# Patient Record
Sex: Female | Born: 1995 | Hispanic: No | Marital: Single | Smoking: Former smoker
Health system: Southern US, Community
[De-identification: ages and names within clinical notes are randomized; demographics above are authoritative.]

## PROBLEM LIST (undated history)

## (undated) ENCOUNTER — Inpatient Hospital Stay (HOSPITAL_COMMUNITY): Payer: Self-pay

## (undated) DIAGNOSIS — F913 Oppositional defiant disorder: Secondary | ICD-10-CM

## (undated) DIAGNOSIS — F329 Major depressive disorder, single episode, unspecified: Secondary | ICD-10-CM

## (undated) DIAGNOSIS — F909 Attention-deficit hyperactivity disorder, unspecified type: Secondary | ICD-10-CM

## (undated) DIAGNOSIS — T71162A Asphyxiation due to hanging, intentional self-harm, initial encounter: Secondary | ICD-10-CM

## (undated) DIAGNOSIS — F32A Depression, unspecified: Secondary | ICD-10-CM

## (undated) DIAGNOSIS — F39 Unspecified mood [affective] disorder: Secondary | ICD-10-CM

## (undated) DIAGNOSIS — F4310 Post-traumatic stress disorder, unspecified: Secondary | ICD-10-CM

## (undated) HISTORY — PX: NO PAST SURGERIES: SHX2092

---
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
## 2016-06-05 ENCOUNTER — Emergency Department (HOSPITAL_COMMUNITY)
Admission: EM | Admit: 2016-06-05 | Discharge: 2016-06-06 | Disposition: A | Discharge status: Home / Self Care | Payer: Medicaid Other | Attending: Emergency Medicine | Admitting: Emergency Medicine

## 2016-06-05 ENCOUNTER — Encounter (HOSPITAL_COMMUNITY): Payer: Self-pay

## 2016-06-05 DIAGNOSIS — Z5321 Procedure and treatment not carried out due to patient leaving prior to being seen by health care provider: Secondary | ICD-10-CM | POA: Diagnosis not present

## 2016-06-05 DIAGNOSIS — S3992XA Unspecified injury of lower back, initial encounter: Secondary | ICD-10-CM | POA: Insufficient documentation

## 2016-06-05 DIAGNOSIS — Y92410 Unspecified street and highway as the place of occurrence of the external cause: Secondary | ICD-10-CM | POA: Insufficient documentation

## 2016-06-05 DIAGNOSIS — M7918 Myalgia, other site: Secondary | ICD-10-CM

## 2016-06-05 DIAGNOSIS — F17200 Nicotine dependence, unspecified, uncomplicated: Secondary | ICD-10-CM | POA: Diagnosis not present

## 2016-06-05 DIAGNOSIS — F909 Attention-deficit hyperactivity disorder, unspecified type: Secondary | ICD-10-CM | POA: Insufficient documentation

## 2016-06-05 DIAGNOSIS — Y999 Unspecified external cause status: Secondary | ICD-10-CM | POA: Diagnosis not present

## 2016-06-05 DIAGNOSIS — Y939 Activity, unspecified: Secondary | ICD-10-CM | POA: Diagnosis not present

## 2016-06-05 LAB — POC URINE PREG, ED: Preg Test, Ur: NEGATIVE

## 2016-06-05 MED ORDER — OXYCODONE-ACETAMINOPHEN 5-325 MG PO TABS
ORAL_TABLET | ORAL | Status: AC
  Filled 2016-06-05: qty 1

## 2016-06-05 MED ORDER — OXYCODONE-ACETAMINOPHEN 5-325 MG PO TABS
1.0000 | ORAL_TABLET | ORAL | Status: DC | PRN
  Administered 2016-06-05: 1 via ORAL

## 2016-06-05 NOTE — ED Provider Notes (Signed)
Patient left without being seen, I did not see or evaluate them.    Lyndal Pulleyaniel Debby Clyne, MD 06/05/16 [phone]
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
## 2016-06-05 NOTE — ED Notes (Signed)
Tech called pt name three times in lobby. No response.

## 2016-06-05 NOTE — ED Notes (Signed)
[facility] requesting a room "now". Updated pt on wait times and educated on acuity.

## 2016-06-06 ENCOUNTER — Emergency Department (HOSPITAL_COMMUNITY): Payer: Medicaid Other

## 2016-06-06 MED ORDER — CYCLOBENZAPRINE HCL 10 MG PO TABS: 10.0000 mg | ORAL_TABLET | Freq: Two times a day (BID) | ORAL | 0 refills | Status: DC | PRN

## 2016-06-06 MED ORDER — NAPROXEN 375 MG PO TABS: 375.0000 mg | ORAL_TABLET | Freq: Two times a day (BID) | ORAL | 0 refills | Status: DC
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
## 2016-06-06 NOTE — ED Notes (Signed)
Pt sts she is ready to go. PA made aware.

## 2016-06-13 ENCOUNTER — Other Ambulatory Visit (HOSPITAL_COMMUNITY)
Admission: RE | Admit: 2016-06-13 | Discharge: 2016-06-13 | Disposition: A | Discharge status: Home / Self Care | Payer: Medicaid Other | Source: Ambulatory Visit | Attending: Family Medicine | Admitting: Family Medicine

## 2016-06-13 ENCOUNTER — Ambulatory Visit (INDEPENDENT_AMBULATORY_CARE_PROVIDER_SITE_OTHER): Payer: Medicaid Other | Admitting: Family Medicine

## 2016-06-13 ENCOUNTER — Encounter: Payer: Self-pay | Admitting: Family Medicine

## 2016-06-13 VITALS — BP 122/62 | HR 96 | Temp 98.4°F | Wt 174.2 lb

## 2016-06-13 DIAGNOSIS — Z113 Encounter for screening for infections with a predominantly sexual mode of transmission: Secondary | ICD-10-CM | POA: Diagnosis not present

## 2016-06-13 DIAGNOSIS — Z202 Contact with and (suspected) exposure to infections with a predominantly sexual mode of transmission: Secondary | ICD-10-CM | POA: Insufficient documentation

## 2016-06-13 DIAGNOSIS — Z309 Encounter for contraceptive management, unspecified: Secondary | ICD-10-CM

## 2016-06-13 LAB — POCT WET PREP (WET MOUNT)
Clue Cells Wet Prep Whiff POC: NEGATIVE
Trichomonas Wet Prep HPF POC: ABSENT

## 2016-06-13 LAB — POCT URINE PREGNANCY: PREG TEST UR: NEGATIVE

## 2016-06-13 MED ORDER — MEDROXYPROGESTERONE ACETATE 150 MG/ML IM SUSP
150.0000 mg | Freq: Once | INTRAMUSCULAR | Status: AC
  Administered 2016-06-13: 150 mg via INTRAMUSCULAR

## 2016-06-13 NOTE — Assessment & Plan Note (Signed)
GC/chlamydia collected today. Wet mount neg.

## 2016-06-13 NOTE — Patient Instructions (Signed)
It was good to meet you today!  For birth control - You received a depo injection today - Please return in 2 weeks for a urine pregnancy test  We are checking some labs today, and someone will call you or send you a letter with the results when they are available.   Dr. Leland HerElsia J Kirbie Stodghill, DO [facility]

## 2016-06-13 NOTE — Assessment & Plan Note (Signed)
UPT negative today so depo provera injection given. Advised patient to return in 2 weeks for repeat UPT since has been sexually active in the last week without contraception. Also advised patient to use backup condoms for 2 weeks following today.

## 2016-06-13 NOTE — Progress Notes (Signed)
    Subjective:  Stacey [name] is a 20 y.o. female who [facility] need [facility].  HPI: Need for contraception Patient is requesting depo-provera for contraception. Per chart review patient previously on nexplanon and had it removed after 3 years on 11/01/15, was prescribed OCPs at that time. States did not take OCPs because she found it difficult to remember to take them daily and therefore decided she would do better on a different method of birth control. Has had regular monthly periods while being off depo. LMP 06/05/16, has been sexually active in the last week without contraception.  Possible STD exposure States is monogamous with one female partner but has concerns for possible STD exposure on his part. Denies vaginal discharge, itching, rashes, dysuria.   ROS: Per HPI  Objective:  Physical Exam: BP 122/62   Pulse 96   Temp 98.4 F (36.9 C) (Oral)   Wt 174 lb 3.2 oz (79 kg)   LMP 06/05/2016 Comment: neg preg test 06/05/16  SpO2 98%   BMI 27.28 kg/m   Gen: NAD, resting comfortably CV: RRR with no murmurs appreciated Pulm: NWOB, CTAB with no crackles, wheezes, or rhonchi GU: normal female, no rashes. Vaginal walls pink and moist without lesions. No abnormal discharge from the cervix Skin: warm, dry Neuro: grossly normal, moves all extremities Psych: Normal affect and thought content  Results for orders placed or performed in visit on 06/13/16 (from the past 72 hour(s))  POCT urine pregnancy     Status: None   Collection Time: 06/13/16  3:38 PM  Result Value Ref Range   Preg Test, Ur Negative Negative  POCT Wet Prep Mellody Drown(Wet [place])     Status: Abnormal   Collection Time: 06/13/16  4:20 PM  Result Value Ref Range   Source Wet Prep POC VAG    WBC, Wet Prep HPF POC NONE    Bacteria Wet Prep HPF POC Few Few   Clue Cells Wet Prep HPF POC None None   Clue Cells Wet Prep Whiff POC Negative Whiff    Yeast Wet Prep HPF POC Few    Trichomonas Wet  Prep HPF POC Absent Absent     Assessment/Plan:  Contraception management UPT negative today so depo provera injection given. Advised patient to return in 2 weeks for repeat UPT since has been sexually active in the last week without contraception. Also advised patient to use backup condoms for 2 weeks following today.  Possible exposure to STD GC/chlamydia collected today. Wet mount neg.   Leland HerElsia J Masiya Claassen, DO PGY-1, [facility] 06/13/2016 4:04 PM

## 2016-06-14 LAB — GC/CHLAMYDIA PROBE AMP (~~LOC~~) NOT AT ARMC
Chlamydia: NEGATIVE
Neisseria Gonorrhea: NEGATIVE

## 2016-06-15 ENCOUNTER — Encounter: Payer: Self-pay | Admitting: Family Medicine

## 2016-07-26 ENCOUNTER — Ambulatory Visit (INDEPENDENT_AMBULATORY_CARE_PROVIDER_SITE_OTHER): Payer: Medicaid Other | Admitting: Family Medicine

## 2016-07-26 ENCOUNTER — Encounter: Payer: Self-pay | Admitting: Family Medicine

## 2016-07-26 VITALS — BP 108/76 | HR 74 | Temp 98.5°F | Ht 67.0 in | Wt 170.6 lb

## 2016-07-26 DIAGNOSIS — B349 Viral infection, unspecified: Secondary | ICD-10-CM | POA: Diagnosis not present

## 2016-07-26 NOTE — Patient Instructions (Addendum)
It appears that you have a viral upper respiratory infection (cold).  Cold symptoms can last up to 2 weeks.    - Get plenty of rest and drink plenty of fluids. - Try to breathe moist air. Use a humidifier or take a steamy shower. - Consume warm fluids (soup or tea) to provide relief for a stuffy nose and to loosen phlegm. - For nasal stuffiness, try saline nasal spray or a Neti Pot. - For sore throat pain relief: suck on throat lozenges, hard candy or popsicles; gargle with warm salt water (1/4 tsp. salt per 8 oz. of water); and eat soft, bland foods. - Eat a well-balanced diet. If you cannot, ensure you are getting enough nutrients by taking a daily multivitamin. - Avoid dairy products, as they can thicken phlegm. - Avoid alcohol, as it impairs your body's immune system.  CONTACT YOUR DOCTOR IF YOU EXPERIENCE ANY OF THE FOLLOWING: - High fever - Ear pain - Sinus-type headache - Unusually severe cold symptoms - Cough that gets worse while other cold symptoms improve - Flare up of any chronic lung problem, such as asthma - Your symptoms persist longer than 2 weeks  Food Choices to Help Relieve Diarrhea, Adult When you have diarrhea, the foods you eat and your eating habits are very important. Choosing the right foods and drinks can help relieve diarrhea. Also, because diarrhea can last up to 7 days, you need to replace lost fluids and electrolytes (such as sodium, potassium, and chloride) in order to help prevent dehydration. What general guidelines do I need to follow?  Slowly drink 1 cup (8 oz) of fluid for each episode of diarrhea. If you are getting enough fluid, your urine will be clear or pale yellow.  Eat starchy foods. Some good choices include white rice, white toast, pasta, low-fiber cereal, baked potatoes (without the skin), saltine crackers, and bagels.  Avoid large servings of any cooked vegetables.  Limit fruit to two servings per day. A serving is  cup or 1 small  piece.  Choose foods with less than 2 g of fiber per serving.  Limit fats to less than 8 tsp (38 g) per day.  Avoid fried foods.  Eat foods that have probiotics in them. Probiotics can be found in certain dairy products.  Avoid foods and beverages that may increase the speed at which food moves through the stomach and intestines (gastrointestinal tract). Things to avoid include:  High-fiber foods, such as dried fruit, raw fruits and vegetables, nuts, seeds, and whole grain foods.  Spicy foods and high-fat foods.  Foods and beverages sweetened with high-fructose corn syrup, honey, or sugar alcohols such as xylitol, sorbitol, and mannitol. What foods are recommended? Grains  White rice. White, [place]French, or pita breads (fresh or toasted), including plain rolls, buns, or bagels. White pasta. Saltine, soda, or graham crackers. Pretzels. Low-fiber cereal. Cooked cereals made with water (such as cornmeal, farina, or cream cereals). Plain muffins. Matzo. Melba toast. Zwieback. Vegetables  Potatoes (without the skin). Strained tomato and vegetable juices. Most well-cooked and canned vegetables without seeds. Tender lettuce. Fruits  Cooked or canned applesauce, apricots, cherries, fruit cocktail, grapefruit, peaches, pears, or plums. Fresh bananas, apples without skin, cherries, grapes, cantaloupe, grapefruit, peaches, oranges, or plums. Meat and Other Protein Products  Baked or boiled chicken. Eggs. Tofu. Fish. Seafood. Smooth peanut butter. Ground or well-cooked tender beef, ham, veal, lamb, pork, or poultry. Dairy  Plain yogurt, kefir, and unsweetened liquid yogurt. Lactose-free milk, buttermilk, or soy  milk. Plain hard cheese. Beverages  Sport drinks. Clear broths. Diluted fruit juices (except prune). Regular, caffeine-free sodas such as ginger ale. Water. Decaffeinated teas. Oral rehydration solutions. Sugar-free beverages not sweetened with sugar alcohols. Other  Bouillon, broth, or soups  made from recommended foods. The items listed above may not be a complete list of recommended foods or beverages. Contact your dietitian for more options.  What foods are not recommended? Grains  Whole grain, whole wheat, bran, or rye breads, rolls, pastas, crackers, and cereals. Wild or brown rice. Cereals that contain more than 2 g of fiber per serving. Corn tortillas or taco shells. Cooked or dry oatmeal. Granola. Popcorn. Vegetables  Raw vegetables. Cabbage, broccoli, Brussels sprouts, artichokes, baked beans, beet greens, corn, kale, legumes, peas, sweet potatoes, and yams. Potato skins. Cooked spinach and cabbage. Fruits  Dried fruit, including raisins and dates. Raw fruits. Stewed or dried prunes. Fresh apples with skin, apricots, mangoes, pears, raspberries, and strawberries. Meat and Other Protein Products  Chunky peanut butter. Nuts and seeds. Beans and lentils. Tomasa Blase. Dairy  High-fat cheeses. Milk, chocolate milk, and beverages made with milk, such as milk shakes. Cream. Ice cream. Sweets and Desserts  Sweet rolls, doughnuts, and sweet breads. Pancakes and waffles. Fats and Oils  Butter. Cream sauces. Margarine. Salad oils. Plain salad dressings. Olives. Avocados. Beverages  Caffeinated beverages (such as coffee, tea, soda, or energy drinks). Alcoholic beverages. Fruit juices with pulp. Prune juice. Soft drinks sweetened with high-fructose corn syrup or sugar alcohols. Other  Coconut. Hot sauce. Chili powder. Mayonnaise. Gravy. Cream-based or milk-based soups. The items listed above may not be a complete list of foods and beverages to avoid. Contact your dietitian for more information.  What should I do if I become dehydrated? Diarrhea can sometimes lead to dehydration. Signs of dehydration include dark urine and dry mouth and skin. If you think you are dehydrated, you should rehydrate with an oral rehydration solution. These solutions can be purchased at pharmacies, retail stores,  or online. Drink -1 cup (120-240 mL) of oral rehydration solution each time you have an episode of diarrhea. If drinking this amount makes your diarrhea worse, try drinking smaller amounts more often. For example, drink 1-3 tsp (5-15 mL) every 5-10 minutes. A general rule for staying hydrated is to drink 1-2 L of fluid per day. Talk to your health care provider about the specific amount you should be drinking each day. Drink enough fluids to keep your urine clear or pale yellow. This information is not intended to replace advice given to you by your health care provider. Make sure you discuss any questions you have with your health care provider. Document Released: 08/25/2003 Document Revised: 11/10/2015 Document Reviewed: 04/27/2013 Elsevier Interactive Patient Education  2017 [employer].

## 2016-07-26 NOTE — Progress Notes (Signed)
   Subjective: CC: illness WUJ:[id number]:Stacey [name] is a 21 y.o. female presenting to clinic today for same day appointment. PCP: Janit PaganENIOLA, KEHINDE, MD Concerns today include:  1. URI/GI Patient reports a 5 day history of diarrhea, cough, congestion, congestion.  She reports a fever at school yesterday.  She notes that she had a fever to 101F yesterday.  She has been using holistic Congohinese remedies at home with moderate improvement but notes that she is having to miss school and work due to illness and needs documentation for this.  She works at [employer] and is in Restaurant manager, fast foodcosmetology school.  She has been hydrating well.  Denies vomiting, hematochezia.  Allergies  Allergen Reactions  . Penicillins Other (See Comments)    Has patient had a PCN reaction causing immediate rash, facial/tongue/throat swelling, SOB or lightheadedness with hypotension: No Has patient had a PCN reaction causing severe rash involving mucus membranes or skin necrosis: No Has patient had a PCN reaction that required hospitalization No Has pt had a PCN reaction in the last 10 years? No. Nearly universal familial allergy. Pt has never taken PCN.     Social Hx reviewed: active smoker. MedHx, current medications and allergies reviewed.  Please see EMR. ROS: Per HPI  Objective: Office vital signs reviewed. BP 108/76   Pulse 74   Temp 98.5 F (36.9 C) (Oral)   Ht 5\' 7"  (1.702 m)   Wt 170 lb 9.6 oz (77.4 kg)   LMP 07/04/2016 Comment: started as spotting and is still bleeding  SpO2 98%   BMI 26.72 kg/m   Physical Examination:  General: Awake, alert, well nourished, well appearing female, lying down on exam table covered up, No acute distress HEENT: Normal    Neck: No masses palpated. No lymphadenopathy    Ears: Tympanic membranes intact, normal light reflex, no erythema, no bulging    Eyes: PERRLA, EOMI, sclera white    Nose: nasal turbinates moist, no nasal discharge    Throat: moist mucus membranes, no erythema,  no tonsillar exudate.  Airway is patent Cardio: regular rate and rhythm, S1S2 heard, no murmurs appreciated Pulm: clear to auscultation bilaterally, no wheezes, rhonchi or rales; normal work of breathing on room air GI: soft, non-tender, non-distended, bowel sounds present x4, no hepatomegaly, no splenomegaly, no masses  Assessment/ Plan: 21 y.o. female   1. Viral illness.  Suspect that patient is at the end of a viral illness.  She clinically is well appearing with a normal exam.  She works very closely with people.  I agree that if she is having fever at home, she should avoid direct contact with food/ client's faces (facials). - Home care reviewed - Hydration encouraged - Return precautions reviewed - Work/ School notes provided - Follow up with pcp prn   Raliegh IpAshly M Gottschalk, DO PGY-3, [facility]

## 2016-07-27 ENCOUNTER — Ambulatory Visit: Payer: Medicaid Other | Admitting: Family Medicine

## 2016-08-07 ENCOUNTER — Ambulatory Visit: Payer: Medicaid Other | Admitting: Family Medicine

## 2016-08-15 ENCOUNTER — Encounter: Payer: Self-pay | Admitting: Family Medicine

## 2016-08-15 ENCOUNTER — Ambulatory Visit (INDEPENDENT_AMBULATORY_CARE_PROVIDER_SITE_OTHER): Payer: Medicaid Other | Admitting: Family Medicine

## 2016-08-15 VITALS — BP 110/58 | HR 93 | Temp 98.2°F | Ht 67.0 in | Wt 170.0 lb

## 2016-08-15 DIAGNOSIS — B9789 Other viral agents as the cause of diseases classified elsewhere: Secondary | ICD-10-CM

## 2016-08-15 DIAGNOSIS — J069 Acute upper respiratory infection, unspecified: Secondary | ICD-10-CM

## 2016-08-15 DIAGNOSIS — R112 Nausea with vomiting, unspecified: Secondary | ICD-10-CM

## 2016-08-15 LAB — POCT URINALYSIS DIPSTICK
Bilirubin, UA: NEGATIVE
Blood, UA: NEGATIVE
GLUCOSE UA: NEGATIVE
KETONES UA: NEGATIVE
Leukocytes, UA: NEGATIVE
Nitrite, UA: NEGATIVE
Protein, UA: NEGATIVE
SPEC GRAV UA: 1.025
Urobilinogen, UA: 0.2
pH, UA: 7

## 2016-08-15 LAB — POCT URINE PREGNANCY: Preg Test, Ur: NEGATIVE

## 2016-08-15 MED ORDER — IPRATROPIUM BROMIDE 0.06 % NA SOLN: 2.0000 | Freq: Four times a day (QID) | NASAL | 12 refills | Status: DC

## 2016-08-15 MED ORDER — ONDANSETRON HCL 4 MG PO TABS: 4.0000 mg | ORAL_TABLET | Freq: Three times a day (TID) | ORAL | 0 refills | Status: DC | PRN

## 2016-08-15 MED ORDER — GUAIFENESIN-DM 100-10 MG/5ML PO SYRP: 5.0000 mL | ORAL_SOLUTION | ORAL | 0 refills | Status: DC | PRN

## 2016-08-15 MED ORDER — LORATADINE 10 MG PO TABS: 10.0000 mg | ORAL_TABLET | Freq: Every day | ORAL | 11 refills | Status: DC

## 2016-08-15 NOTE — Addendum Note (Signed)
Addended by: Ardith DarkPARKER, Glee Lashomb M on: 08/15/2016 11:45 AM   Modules accepted: Orders

## 2016-08-15 NOTE — Patient Instructions (Signed)
You have a viral infection.  Please take the nasal spray and cough medication as needed.  Take the zofran as needed.  Start the claritin.  Take care,  Dr Jimmey RalphParker

## 2016-08-15 NOTE — Progress Notes (Signed)
   Subjective:  Stacey [name] is a 21 y.o. female who [facility] with a chief complaint of nausea/vomting.   HPI:  Nausea/Vomiting Ate Chipotle two days ago - thinks that it is related to this. Since she has not been able to keep anything down. She has tried "old" zofran which helped some. No diarrhea. No abdominal pain. No fevers or chills.   Cough Symptoms started about 3 weeks ago. She has tried taking nyquil which helped some. Mild sore throat when coughing. Coughing up white-yellow phlegm. Worse at night. She has a little brother with similar symptoms. No shortness of breath.   ROS: Per HPI  PMH: Smoking history reviewed.   Objective:  Physical Exam: BP (!) 110/58   Pulse 93   Temp 98.2 F (36.8 C) (Oral)   Ht 5\' 7"  (1.702 m)   Wt 170 lb (77.1 kg)   SpO2 98%   BMI 26.63 kg/m   Gen: NAD, resting comfortably HEENT: TMs obscured by cerumen bilaterally. OP clear. MMM.  CV: RRR with no murmurs appreciated Pulm: NWOB, CTAB with no crackles, wheezes, or rhonchi GI: Normal bowel sounds present. Soft, Nontender, Nondistended. MSK: no edema, cyanosis, or clubbing noted Skin: warm, dry Neuro: grossly normal, moves all extremities Psych: Normal affect and thought content  Assessment/Plan:  Nausea and Vomiting Likely food poisoning vs mild gastroenteritis. No red flag signs or symptoms. Appears well hydrated. Will give zofran. Return precautions reviewed. Follow up as needed.  Viral URI with Cough No signs of bacterial infection. Will treat symptomatically withatrovent and robutussin. Will also start claritin. Return precautions reviewed. Follow up as needed.   Katina Degreealeb M. Jimmey RalphParker, MD [facility] Resident PGY-3 08/15/2016 11:29 AM

## 2016-09-17 ENCOUNTER — Ambulatory Visit (INDEPENDENT_AMBULATORY_CARE_PROVIDER_SITE_OTHER): Payer: Medicaid Other | Admitting: Student

## 2016-09-17 ENCOUNTER — Other Ambulatory Visit (HOSPITAL_COMMUNITY)
Admission: RE | Admit: 2016-09-17 | Discharge: 2016-09-17 | Disposition: A | Discharge status: Home / Self Care | Payer: Medicaid Other | Source: Ambulatory Visit | Attending: Family Medicine | Admitting: Family Medicine

## 2016-09-17 ENCOUNTER — Telehealth: Payer: Self-pay | Admitting: Student

## 2016-09-17 ENCOUNTER — Encounter: Payer: Self-pay | Admitting: Student

## 2016-09-17 VITALS — BP 110/64 | HR 77 | Temp 98.9°F | Ht 67.0 in | Wt 174.0 lb

## 2016-09-17 DIAGNOSIS — Z3042 Encounter for surveillance of injectable contraceptive: Secondary | ICD-10-CM | POA: Diagnosis not present

## 2016-09-17 DIAGNOSIS — R300 Dysuria: Secondary | ICD-10-CM

## 2016-09-17 DIAGNOSIS — N898 Other specified noninflammatory disorders of vagina: Secondary | ICD-10-CM

## 2016-09-17 DIAGNOSIS — Z113 Encounter for screening for infections with a predominantly sexual mode of transmission: Secondary | ICD-10-CM | POA: Insufficient documentation

## 2016-09-17 DIAGNOSIS — R3989 Other symptoms and signs involving the genitourinary system: Secondary | ICD-10-CM

## 2016-09-17 LAB — POCT WET PREP (WET MOUNT)
Clue Cells Wet Prep Whiff POC: POSITIVE
TRICHOMONAS WET PREP HPF POC: ABSENT

## 2016-09-17 LAB — POCT URINALYSIS DIP (MANUAL ENTRY)
BILIRUBIN UA: NEGATIVE
BILIRUBIN UA: NEGATIVE
GLUCOSE UA: NEGATIVE
Nitrite, UA: NEGATIVE
Protein Ur, POC: 30 — AB
Spec Grav, UA: 1.02 (ref 1.030–1.035)
Urobilinogen, UA: 0.2 (ref ?–2.0)
pH, UA: 8.5 — AB (ref 5.0–8.0)

## 2016-09-17 LAB — POCT UA - MICROSCOPIC ONLY

## 2016-09-17 LAB — POCT URINE PREGNANCY: PREG TEST UR: NEGATIVE

## 2016-09-17 MED ORDER — SULFAMETHOXAZOLE-TRIMETHOPRIM 800-160 MG PO TABS: 1.0000 | ORAL_TABLET | Freq: Two times a day (BID) | ORAL | 0 refills | Status: DC

## 2016-09-17 MED ORDER — METRONIDAZOLE 500 MG PO TABS: 500.0000 mg | ORAL_TABLET | Freq: Two times a day (BID) | ORAL | 0 refills | Status: DC

## 2016-09-17 MED ORDER — MEDROXYPROGESTERONE ACETATE 150 MG/ML IM SUSP
150.0000 mg | Freq: Once | INTRAMUSCULAR | Status: AC
  Administered 2016-09-17: 150 mg via INTRAMUSCULAR

## 2016-09-17 NOTE — Assessment & Plan Note (Signed)
Vaginal discharge with overall reassuring exam. She did have some abdominal pain but no CMT. With no fever and no CMT PID is less likely. Abdominal pain is more likely due to concurrent UTI - wet prep and GC/CT collected, will treat as needed

## 2016-09-17 NOTE — Assessment & Plan Note (Signed)
UA shows evidence of UTI. Will treat with bactrim. Obtain Culture to assess sesntivities

## 2016-09-17 NOTE — Patient Instructions (Signed)
You have a Urinary Tract infection Please take Bactrim as prescribed You will be called about any other abnormal labs If you have questions or concerns, call the office at [phone]

## 2016-09-17 NOTE — Progress Notes (Signed)
   Subjective:    Patient ID: Stacey [name], female    DOB: [birth year], 21 y.o.   MRN: [id number]   CC: vaginal discharge  HPI: 21 y/o presents for vaginal discharge  Vaginal discharge - present for 1 week - no odor or vaginal pain   Dysuria - present for 3-4 days - some suprapubic pain - no fevers or back pain  Contraception - on depo provera - she is over due for it  Smoking status reviewed  Review of Systems  Per HPI, else denies, chest pain, shortness of breath, abdominal pain    Objective:  BP 110/64   Pulse 77   Temp 98.9 F (37.2 C) (Oral)   Ht  (1.702 m)   Wt 174 lb (78.9 kg)   SpO2 99%   BMI 27.25 kg/m  Vitals and nursing note reviewed  General: NAD Cardiac: RRR Respiratory: CTAB, normal effort Pelvic: Normal EGBUS, normal vaginal canal with moderate white discharge, normal cervix with no CMT, normal mobile uterus, normal adnexa with no masses, but mild adnexal and suprapubic tenderness     Assessment & Plan:    Dysuria UA shows evidence of UTI. Will treat with bactrim. Obtain Culture to assess sesntivities  Contraception U preg negative. Depo provera given. LARC counseling given and contraception hand out given  Vaginal discharge Vaginal discharge with overall reassuring exam. She did have some abdominal pain but no CMT. With no fever and no CMT PID is less likely. Abdominal pain is more likely due to concurrent UTI - wet prep and GC/CT collected, will treat as needed    Alyssa A. Kennon Rounds MD, MS [facility] Resident PGY-3 Pager [phone]

## 2016-09-17 NOTE — Assessment & Plan Note (Signed)
U preg negative. Depo provera given. LARC counseling given and contraception hand out given

## 2016-09-18 ENCOUNTER — Ambulatory Visit: Payer: Medicaid Other | Admitting: Family Medicine

## 2016-09-18 LAB — URINE CYTOLOGY ANCILLARY ONLY
CHLAMYDIA, DNA PROBE: NEGATIVE
Neisseria Gonorrhea: NEGATIVE

## 2016-09-19 NOTE — Telephone Encounter (Signed)
Please call the patient and tell her that her GC/Ct were negative

## 2016-09-19 NOTE — Telephone Encounter (Signed)
LM for patient to call back. Dezzie Badilla,CMA  

## 2016-09-19 NOTE — Telephone Encounter (Signed)
Pt contacted and informed of negative std results.  

## 2016-09-27 ENCOUNTER — Encounter: Payer: Self-pay | Admitting: Obstetrics and Gynecology

## 2016-09-27 ENCOUNTER — Ambulatory Visit (INDEPENDENT_AMBULATORY_CARE_PROVIDER_SITE_OTHER): Payer: Medicaid Other | Admitting: Obstetrics and Gynecology

## 2016-09-27 VITALS — BP 98/60 | HR 98 | Temp 98.1°F | Wt 171.0 lb

## 2016-09-27 DIAGNOSIS — K529 Noninfective gastroenteritis and colitis, unspecified: Secondary | ICD-10-CM | POA: Diagnosis present

## 2016-09-27 MED ORDER — ONDANSETRON HCL 4 MG PO TABS: 4.0000 mg | ORAL_TABLET | Freq: Three times a day (TID) | ORAL | 0 refills | Status: DC | PRN

## 2016-09-27 NOTE — Patient Instructions (Signed)

## 2016-09-27 NOTE — Progress Notes (Signed)
   Subjective:   Patient ID: Stacey [name], female    DOB: August 14, 1995, 21 y.o.   MRN: [id number]  Patient presents for Same Day Appointment  Chief Complaint  Patient presents with  . Emesis  . Diarrhea    HPI: # Upset stomach Cant eat and cant hold water down Started Sunday of last week hasnt had a way to get here  Abdominal pain; intermittent symptoms are improving Throwing up everytime puts something in mouth Missed work Using the bathroom normal  Loose stools  No fevers or chills Tried pepto but didn't work  On depo for birth control  Review of Systems   See HPI for ROS.   History  Smoking Status  . Current Every Day Smoker  Smokeless Tobacco  . Never Used    Past medical history, surgical, family, and social history reviewed and updated in the EMR as appropriate.  Pertinent Historical Findings include: None Objective:  BP 98/60   Pulse 98   Temp 98.1 F (36.7 C) (Oral)   Wt 171 lb (77.6 kg)   LMP  (LMP Unknown)   SpO2 99%   BMI 26.78 kg/m  Vitals and nursing note reviewed  Physical Exam  Constitutional: She is well-developed, well-nourished, and in no distress.  HENT:  Mouth/Throat: Oropharynx is clear and moist.  Neck: Normal range of motion. Neck supple.  Cardiovascular: Normal rate, regular rhythm and normal heart sounds.   Pulmonary/Chest: Effort normal and breath sounds normal.  Abdominal: Soft. Bowel sounds are normal. She exhibits no distension. There is no tenderness. There is no guarding.    Assessment & Plan:  1. Gastroenteritis, acute History consistent with a viral gastroenteritis. Vitals are stable and afebrile. Patient has improving symptoms. Able to tolerate liquids but still some nausea with solids. Rx given for zofran. Patient encouraged to stay hydrated. Conservative measures. Work note given.   Diagnosis and plan along with any newly prescribed medication(s) were discussed in detail with this patient today. The patient  verbalized understanding and agreed with the plan. Patient advised if symptoms worsen return to clinic or ER.   PATIENT EDUCATION PROVIDED: See AVS   Caryl Ada, DO 09/27/2016, 2:10 PM PGY-3, [facility]

## 2017-05-07 ENCOUNTER — Ambulatory Visit: Payer: Self-pay | Admitting: Family Medicine

## 2017-05-14 DIAGNOSIS — H5213 Myopia, bilateral: Secondary | ICD-10-CM | POA: Diagnosis not present

## 2017-05-14 DIAGNOSIS — H52223 Regular astigmatism, bilateral: Secondary | ICD-10-CM | POA: Diagnosis not present

## 2017-07-17 ENCOUNTER — Ambulatory Visit: Payer: Medicaid Other | Admitting: Internal Medicine

## 2017-07-19 ENCOUNTER — Encounter: Payer: Self-pay | Admitting: Family Medicine

## 2017-07-19 ENCOUNTER — Ambulatory Visit (INDEPENDENT_AMBULATORY_CARE_PROVIDER_SITE_OTHER): Payer: Medicaid Other | Admitting: Family Medicine

## 2017-07-19 ENCOUNTER — Other Ambulatory Visit (HOSPITAL_COMMUNITY)
Admission: RE | Admit: 2017-07-19 | Discharge: 2017-07-19 | Disposition: A | Discharge status: Home / Self Care | Payer: Medicaid Other | Source: Ambulatory Visit | Attending: Family Medicine | Admitting: Family Medicine

## 2017-07-19 ENCOUNTER — Telehealth: Payer: Self-pay | Admitting: Family Medicine

## 2017-07-19 ENCOUNTER — Other Ambulatory Visit: Payer: Self-pay

## 2017-07-19 VITALS — BP 106/76 | HR 100 | Temp 98.7°F | Ht 67.0 in | Wt 141.4 lb

## 2017-07-19 DIAGNOSIS — Z7689 Persons encountering health services in other specified circumstances: Secondary | ICD-10-CM | POA: Diagnosis not present

## 2017-07-19 DIAGNOSIS — Z113 Encounter for screening for infections with a predominantly sexual mode of transmission: Secondary | ICD-10-CM

## 2017-07-19 DIAGNOSIS — F39 Unspecified mood [affective] disorder: Secondary | ICD-10-CM | POA: Diagnosis not present

## 2017-07-19 DIAGNOSIS — A5901 Trichomonal vulvovaginitis: Secondary | ICD-10-CM | POA: Diagnosis not present

## 2017-07-19 LAB — POCT WET PREP (WET MOUNT): Clue Cells Wet Prep Whiff POC: POSITIVE

## 2017-07-19 MED ORDER — METRONIDAZOLE 500 MG PO TABS: ORAL_TABLET | ORAL | 0 refills | Status: DC

## 2017-07-19 NOTE — Assessment & Plan Note (Signed)
Seems to be doing well off meds. Symptoms resolved. We will monitor.

## 2017-07-19 NOTE — Progress Notes (Signed)
Subjective:     Patient ID: Stacey [name], female   DOB: 05-20-1996, 21 y.o.   MRN: [id number]  Vaginal Discharge  The patient's primary symptoms include vaginal discharge. The patient's pertinent negatives include no genital itching, genital lesions, genital odor or vaginal bleeding. Primary symptoms comment: LMP: January 2019. This is a new problem. The current episode started 1 to 4 weeks ago (1 week ago). The problem occurs intermittently. The problem has been unchanged. The patient is experiencing no pain. Associated symptoms include nausea. Pertinent negatives include no abdominal pain, dysuria, fever, frequency or vomiting. Associated symptoms comments: Sometimes she has belly pain. The vaginal discharge was clear and white. The vaginal bleeding is spotting (Occasionally she spots after her period after she got off DEPO). She has not been passing clots. She has not been passing tissue. Nothing aggravates the symptoms. Treatments tried: Good hygiene, cleaning soap and water. The treatment provided mild relief. She is sexually active (With same partner of 7 months). No, her partner does not have an STD. She uses nothing for contraception. Her menstrual history has been regular. There is no history of an STD.  Mood Disorder:Denies any concern. Not on medication. She stated she had mood issues when she was struggling to finish high school. Now that she has graduated she feels happy.   No current outpatient medications on file prior to visit.   No current facility-administered medications on file prior to visit.    Past Medical History:  Diagnosis Date  . ADHD (attention deficit hyperactivity disorder)   . Mood disorder (HCC)   . ODD (oppositional defiant disorder)   . PTSD (post-traumatic stress disorder)   . 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  Gastrointestinal: Positive for nausea. Negative for abdominal pain and vomiting.  Genitourinary: Positive for vaginal discharge. Negative for dysuria and frequency.       Objective:   Physical Exam  Constitutional: She appears well-developed. No distress.  Cardiovascular: Normal rate, regular rhythm and normal heart sounds.  No murmur heard. Pulmonary/Chest: Effort normal and breath sounds normal. No respiratory distress. She has no wheezes.  Abdominal: Soft. Bowel sounds are normal. She exhibits no distension and no mass. There is no tenderness.  Genitourinary: There is no rash, tenderness or lesion on the right labia. There is no rash, tenderness or lesion on the left labia. Cervix exhibits motion tenderness and discharge. Right adnexum displays no tenderness and no fullness. Left adnexum displays no tenderness and no fullness. No tenderness or bleeding in the vagina. No signs of injury around the vagina. Vaginal discharge found.    Psychiatric: She has a normal mood and affect. Her behavior is normal. Thought content normal.  Nursing note and vitals reviewed.  Depression screen PHQ 2/9 07/19/2017  Decreased Interest 0  Down, Depressed, Hopeless 0  PHQ - 2 Score 0  Altered sleeping 0  Tired, decreased energy 0  Change in appetite 0  Feeling bad or failure about yourself  0  Trouble concentrating 0  Moving slowly or fidgety/restless 0  Suicidal thoughts 0  PHQ-9 Score 0  Difficult doing work/chores Not difficult at all       Assessment:  Vaginal discharge Trichomoniasis Bacterial vaginosis    Plan:     See problem list.  She declined flu shot today.  Contraceptive discussed but she is not interested in pursuing at this time. We will readdress at her next visit.

## 2017-07-19 NOTE — Assessment & Plan Note (Addendum)
Wet prep + Trich and BV. I discussed test result with patient and mode of transmission. She asked so many questions related to mode of transmission which I was able to answer. She was appropriately upset. I discussed treatment recommendation and the need for her partner to get treated as well to prevent reinfection. Start Metronidazole 500 mg BID x 7 which will also treat her BV. Patient did not want for further discussion or wait for her new AVS summary. I called multiple times to check on her but no luck. I will like for her to return in 2-3 weeks for test of cure. We will contact her again. STD counseling done.

## 2017-07-19 NOTE — Telephone Encounter (Signed)
Her listed phone number to follow-up with her multiple times with no response. One of the number belongs to someone else/incorrect ( I deleted the number).  Later when I called it was her mother who picked up. She gave me Stacey [name]'s cell phone number: [phone]. Number updated.  I called the number but she did not pick up. Message left for her to call back.

## 2017-07-19 NOTE — Patient Instructions (Addendum)
`Sexually Transmitted Disease A sexually transmitted disease (STD) is a disease or infection that may be passed (transmitted) from person to person, usually during sexual activity. This may happen by way of saliva, semen, blood, vaginal mucus, or urine. Common STDs include:  Gonorrhea.  Chlamydia.  Syphilis.  HIV and AIDS.  Genital herpes.  Hepatitis B and C.  Trichomonas.  Human papillomavirus (HPV).  Pubic lice.  Scabies.  Mites.  Bacterial vaginosis.  What are the causes? An STD may be caused by bacteria, a virus, or parasites. STDs are often transmitted during sexual activity if one person is infected. However, they may also be transmitted through nonsexual means. STDs may be transmitted after:  Sexual intercourse with an infected person.  Sharing sex toys with an infected person.  Sharing needles with an infected person or using unclean piercing or tattoo needles.  Having intimate contact with the genitals, mouth, or rectal areas of an infected person.  Exposure to infected fluids during birth.  What are the signs or symptoms? Different STDs have different symptoms. Some people may not have any symptoms. If symptoms are present, they may include:  Painful or bloody urination.  Pain in the pelvis, abdomen, vagina, anus, throat, or eyes.  A skin rash, itching, or irritation.  Growths, ulcerations, blisters, or sores in the genital and anal areas.  Abnormal vaginal discharge with or without bad odor.  Penile discharge in men.  Fever.  Pain or bleeding during sexual intercourse.  Swollen glands in the groin area.  Yellow skin and eyes (jaundice). This is seen with hepatitis.  Swollen testicles.  Infertility.  Sores and blisters in the mouth.  How is this diagnosed? To make a diagnosis, your health care provider may:  Take a medical history.  Perform a physical exam.  Take a sample of any discharge to examine.  Swab the throat, cervix,  opening to the penis, rectum, or vagina for testing.  Test a sample of your first morning urine.  Perform blood tests.  Perform a Pap test, if this applies.  Perform a colposcopy.  Perform a laparoscopy.  How is this treated? Treatment depends on the STD. Some STDs may be treated but not cured.  Chlamydia, gonorrhea, trichomonas, and syphilis can be cured with antibiotic medicine.  Genital herpes, hepatitis, and HIV can be treated, but not cured, with prescribed medicines. The medicines lessen symptoms.  Genital warts from HPV can be treated with medicine or by freezing, burning (electrocautery), or surgery. Warts may come back.  HPV cannot be cured with medicine or surgery. However, abnormal areas may be removed from the cervix, vagina, or vulva.  If your diagnosis is confirmed, your recent sexual partners need treatment. This is true even if they are symptom-free or have a negative culture or evaluation. They should not have sex until their health care providers say it is okay.  Your health care provider may test you for infection again 3 months after treatment.  How is this prevented? Take these steps to reduce your risk of getting an STD:  Use latex condoms, dental dams, and water-soluble lubricants during sexual activity. Do not use petroleum jelly or oils.  Avoid having multiple sex partners.  Do not have sex with someone who has other sex partners.  Do not have sex with anyone you do not know or who is at high risk for an STD.  Avoid risky sex practices that can break your skin.  Do not have sex if you have open sores  on your mouth or skin. °· Avoid drinking too much alcohol or taking illegal drugs. Alcohol and drugs can affect your judgment and put you in a vulnerable position. °· Avoid engaging in oral and anal sex acts. °· Get vaccinated for HPV and hepatitis. If you have not received these vaccines in the past, talk to your health care provider about whether one or  both might be right for you. °· If you are at risk of being infected with HIV, it is recommended that you take a prescription medicine daily to prevent HIV infection. This is called pre-exposure prophylaxis (PrEP). You are considered at risk if: °? You are a man who has sex with other men (MSM). °? You are a heterosexual man or woman and are sexually active with more than one partner. °? You take drugs by injection. °? You are sexually active with a partner who has HIV. °· Talk with your health care provider about whether you are at high risk of being infected with HIV. If you choose to begin PrEP, you should first be tested for HIV. You should then be tested every 3 months for as long as you are taking PrEP. ° °Contact a health care provider if: °· See your health care provider. °· Tell your sexual partner(s). They should be tested and treated for any STDs. °· Do not have sex until your health care provider says it is okay. °Get help right away if: °Contact your health care provider right away if: °· You have severe abdominal pain. °· You are a man and notice swelling or pain in your testicles. °· You are a woman and notice swelling or pain in your vagina. ° °This information is not intended to replace advice given to you by your health care provider. Make sure you discuss any questions you have with your health care provider. °Document Released: 08/25/2002 Document Revised: 12/23/2015 Document Reviewed: 12/23/2012 °Elsevier Interactive Patient Education © 2018 Else[employer] ° ° °Trichomoniasis °Trichomoniasis is an STI (sexually transmitted infection) that can affect both women and men. In women, the outer area of the female genitalia (vulva) and the vagina are affected. In men, the penis is mainly affected, but the prostate and other reproductive organs can also be involved. This condition can be treated with medicine. It often has no symptoms (is asymptomatic), especially in men. °What are the causes? °This  condition is caused by an organism called Trichomonas vaginalis. Trichomoniasis most often spreads from person to person (is contagious) through sexual contact. °What increases the risk? °The following factors may make you more likely to develop this condition: °· Having unprotected sexual intercourse. °· Having sexual intercourse with a partner who has trichomoniasis. °· Having multiple sexual partners. °· Having had previous trichomoniasis infections or other STIs. ° °What are the signs or symptoms? °In women, symptoms of trichomoniasis include: °· Abnormal vaginal discharge that is clear, white, gray, or yellow-green and foamy and has an unusual "fishy" odor. °· Itching and irritation of the vagina and vulva. °· Burning or pain during urination or sexual intercourse. °· Genital redness and swelling. ° °In men, symptoms of trichomoniasis include: °· Penile discharge that may be foamy or contain pus. °· Pain in the penis. This may happen only when urinating. °· Itching or irritation inside the penis. °· Burning after urination or ejaculation. ° °How is this diagnosed? °In women, this condition may be found during a routine Pap test or physical exam. It may be found in men during a   routine physical exam. Your health care provider may perform tests to help diagnose this infection, such as: °· Urine tests (men and women). °· The following in women: °? Testing the pH of the vagina. °? A vaginal swab test that checks for the Trichomonas vaginalis organism. °? Testing vaginal secretions. ° °Your health care provider may test you for other STIs, including HIV (human immunodeficiency virus). °How is this treated? °This condition is treated with medicine taken by mouth (orally), such as metronidazole or tinidazole to fight the infection. Your sexual partner(s) may also need to be tested and treated. °· If you are a woman and you plan to become pregnant or think you may be pregnant, tell your health care provider right away.  Some medicines that are used to treat the infection should not be taken during pregnancy. ° °Your health care provider may recommend over-the-counter medicines or creams to help relieve itching or irritation. You may be tested for infection again 3 months after treatment. °Follow these instructions at home: °· Take and use over-the-counter and prescription medicines, including creams, only as told by your health care provider. °· Do not have sexual intercourse until one week after you finish your medicine, or until your health care provider approves. Ask your health care provider when you may resume sexual intercourse. °· (Women) Do not douche or wear tampons while you have the infection. °· Discuss your infection with your sexual partner(s). Make sure that your partner gets tested and treated, if necessary. °· Keep all follow-up visits as told by your health care provider. This is important. °How is this prevented? °· Use condoms every time you have sex. Using condoms correctly and consistently can help protect against STIs. °· Avoid having multiple sexual partners. °· Talk with your sexual partner about any symptoms that either of you may have, as well as any history of STIs. °· Get tested for STIs and STDs (sexually transmitted diseases) before you have sex. Ask your partner to do the same. °· Do not have sexual contact if you have symptoms of trichomoniasis or another STI. °Contact a health care provider if: °· You still have symptoms after you finish your medicine. °· You develop pain in your abdomen. °· You have pain when you urinate. °· You have bleeding after sexual intercourse. °· You develop a rash. °· You feel nauseous or you vomit. °· You plan to become pregnant or think you may be pregnant. °Summary °· Trichomoniasis is an STI (sexually transmitted infection) that can affect both women and men. °· This condition often has no symptoms (is asymptomatic), especially in men. °· You should not have sexual  intercourse until one week after you finish your medicine, or until your health care provider approves. Ask your health care provider when you may resume sexual intercourse. °· Discuss your infection with your sexual partner. Make sure that your partner gets tested and treated, if necessary. °This information is not intended to replace advice given to you by your health care provider. Make sure you discuss any questions you have with your health care provider. °Document Released: 11/28/2000 Document Revised: 04/27/2016 Document Reviewed: 04/27/2016 °Elsevier Interactive Patient Education © 2017 Elsevier Inc. ° °

## 2017-07-19 NOTE — Assessment & Plan Note (Signed)
GC/Chlamydia checked. We will contact her with result. HIV and RPR are pending as well.

## 2017-07-22 LAB — CERVICOVAGINAL ANCILLARY ONLY
Chlamydia: NEGATIVE
Neisseria Gonorrhea: NEGATIVE

## 2017-07-22 NOTE — Telephone Encounter (Signed)
I called and discussed RPR result with her. Final result still pending. I need her to come in soon to discuss management. She is also informed that she will need TOC for Trichomonas for which she stated she already started treatment.  I asked her if she wants me to go ahead and schedule appointment. She stated she will call back to do that since she need to double check on her work schedule. All questions were answered.  Note: GC/Chlamydia results are still pending at this point.

## 2017-07-22 NOTE — Telephone Encounter (Signed)
Called patient and she would prefer to discuss these results with provider on the phone first.  She will be at her number waiting for the call. Jazmin Hartsell,CMA

## 2017-07-23 ENCOUNTER — Telehealth: Payer: Self-pay | Admitting: Family Medicine

## 2017-07-23 LAB — HIV ANTIBODY (ROUTINE TESTING W REFLEX): HIV SCREEN 4TH GENERATION: NONREACTIVE

## 2017-07-23 LAB — RPR, QUANT+TP ABS (REFLEX)
Rapid Plasma Reagin, Quant: 1:1 {titer} — ABNORMAL HIGH
T Pallidum Abs: NEGATIVE

## 2017-07-23 LAB — RPR: RPR: REACTIVE — AB

## 2017-07-23 NOTE — Telephone Encounter (Signed)
STD result discussed with her.  GC/Chlamydia neg.  Trichomonas positive and she has been treated. She will return in 3 weeks for TOC.  RPR+ with neg T Pallidium Abs: Likely false positive. I discussed with infectious dx specialist (John Campbell,MD). He recommended repeat test in 3-6 months.   I discussed this with the patient. He verbalized understanding. STD counseling done during her last visit.

## 2017-09-05 ENCOUNTER — Telehealth: Payer: Self-pay | Admitting: Family Medicine

## 2017-09-05 DIAGNOSIS — F39 Unspecified mood [affective] disorder: Secondary | ICD-10-CM

## 2017-09-05 NOTE — Telephone Encounter (Signed)
Will forward to MD to place referral for patient and document in the comment section the information on where to send it.  Shenique Childers,CMA

## 2017-09-05 NOTE — Telephone Encounter (Signed)
This pt called needing orders to get psychological therapy from her counselor she has been seeing.  The fax number to send these orders to is [phone] with ATTN: Emerson MonteKathleen Grumblatt. Call pt at [phone] with any questions.

## 2017-09-09 NOTE — Telephone Encounter (Signed)
Entered psychology referral order.

## 2018-03-18 ENCOUNTER — Other Ambulatory Visit: Payer: Self-pay

## 2018-03-18 ENCOUNTER — Ambulatory Visit (INDEPENDENT_AMBULATORY_CARE_PROVIDER_SITE_OTHER): Payer: Medicaid Other | Admitting: Family Medicine

## 2018-03-18 VITALS — BP 96/54 | HR 85 | Temp 98.3°F | Ht 67.0 in | Wt 144.6 lb

## 2018-03-18 DIAGNOSIS — J302 Other seasonal allergic rhinitis: Secondary | ICD-10-CM | POA: Diagnosis not present

## 2018-03-18 MED ORDER — LORATADINE 10 MG PO TABS: 10.0000 mg | ORAL_TABLET | Freq: Every day | ORAL | 11 refills | Status: DC

## 2018-03-18 MED ORDER — FLUTICASONE PROPIONATE 50 MCG/ACT NA SUSP: 2.0000 | Freq: Every day | NASAL | 6 refills | Status: DC

## 2018-03-18 NOTE — Patient Instructions (Addendum)
It was a pleasure to see you today! Thank you for choosing [facility] for your primary care. Stacey [name] was seen for congestion.   Our plans for today were:  Your congestion is likely due to allergies.   Try the allergy medicine I sent. You can use both the oral tablet and the nasal spray.   You need to come back and see Dr. Lum Babe for your PAP smear, to discuss smoking, and to discuss birth control.   Best,  Dr. Chanetta Marshall

## 2018-03-18 NOTE — Progress Notes (Signed)
   CC: cold sxs, congestion  HPI  Cold sxs - no fevers, but some dry cough and having to clear her throat a lot. Had one loose stool 2 days, no BM since. Normally has BMs every 1-2 days. And has also some nasal congestion, also started 2 days ago. Has tried mucinex, which works in that she doesn't have runny nose and cough is less. She still feels nasal fullness. Has tried nettie pot. Has had allergies in the past, but not the last two years.   Went out to eat on 9/26 and had a lot of seafood, did "drink a little." she vomited that night and felt dizzy. Sometimes she gets nauseous with her period.   ROS: Denies CP, SOB, abdominal pain, dysuria, changes in BMs.   CC, SH/smoking status, and VS noted  Objective: BP (!) 96/54   Pulse 85   Temp 98.3 F (36.8 C) (Oral)   Ht 5\' 7"  (1.702 m)   Wt 144 lb 9.6 oz (65.6 kg)   LMP 03/16/2018 (Exact Date)   SpO2 97%   BMI 22.65 kg/m  Gen: NAD, alert, cooperative, and pleasant. HEENT: NCAT, EOMI, PERRL, TMs clear bilaterally, oropharynx clear, nasal turbinates injected and swollen.  CV: RRR, no murmur Resp: CTAB, no wheezes, non-labored Ext: No edema, warm Neuro: Alert and oriented, Speech clear, No gross deficits  Assessment and plan:  Seasonal allergic rhinitis No infectious sounding symptoms, exam with just inflammed turbinates. Start flonase and claritin, return if no improvement or further concerns.    No orders of the defined types were placed in this encounter.   Meds ordered this encounter  Medications  . loratadine (CLARITIN) 10 MG tablet    Sig: Take 1 tablet (10 mg total) by mouth daily.    Dispense:  30 tablet    Refill:  11  . fluticasone (FLONASE) 50 MCG/ACT nasal spray    Sig: Place 2 sprays into both nostrils daily.    Dispense:  16 g    Refill:  6    Health Maintenance reviewed - patient declines flu shot, offered discussion of birth control options but she wants to follow up withDr. Lum Babe for this.  Loni Muse, MD, PGY3 03/19/2018 1:37 PM

## 2018-03-19 DIAGNOSIS — J302 Other seasonal allergic rhinitis: Secondary | ICD-10-CM | POA: Insufficient documentation

## 2018-03-19 NOTE — Assessment & Plan Note (Signed)
No infectious sounding symptoms, exam with just inflammed turbinates. Start flonase and claritin, return if no improvement or further concerns.

## 2018-04-01 ENCOUNTER — Ambulatory Visit: Payer: Medicaid Other

## 2018-04-02 ENCOUNTER — Other Ambulatory Visit (HOSPITAL_COMMUNITY)
Admission: RE | Admit: 2018-04-02 | Discharge: 2018-04-02 | Disposition: A | Discharge status: Home / Self Care | Payer: Medicaid Other | Source: Ambulatory Visit | Attending: Family Medicine | Admitting: Family Medicine

## 2018-04-02 ENCOUNTER — Encounter: Payer: Self-pay | Admitting: Family Medicine

## 2018-04-02 ENCOUNTER — Other Ambulatory Visit: Payer: Self-pay

## 2018-04-02 ENCOUNTER — Ambulatory Visit (INDEPENDENT_AMBULATORY_CARE_PROVIDER_SITE_OTHER): Payer: Medicaid Other | Admitting: Family Medicine

## 2018-04-02 VITALS — BP 110/70 | HR 101 | Temp 98.2°F | Wt 142.0 lb

## 2018-04-02 DIAGNOSIS — Z01419 Encounter for gynecological examination (general) (routine) without abnormal findings: Secondary | ICD-10-CM

## 2018-04-02 DIAGNOSIS — Z7251 High risk heterosexual behavior: Secondary | ICD-10-CM | POA: Diagnosis not present

## 2018-04-02 DIAGNOSIS — Z124 Encounter for screening for malignant neoplasm of cervix: Secondary | ICD-10-CM | POA: Diagnosis not present

## 2018-04-02 DIAGNOSIS — N898 Other specified noninflammatory disorders of vagina: Secondary | ICD-10-CM | POA: Diagnosis not present

## 2018-04-02 DIAGNOSIS — Z3009 Encounter for other general counseling and advice on contraception: Secondary | ICD-10-CM

## 2018-04-02 DIAGNOSIS — A530 Latent syphilis, unspecified as early or late: Secondary | ICD-10-CM

## 2018-04-02 LAB — POCT WET PREP (WET MOUNT)
Clue Cells Wet Prep Whiff POC: POSITIVE
Trichomonas Wet Prep HPF POC: ABSENT

## 2018-04-02 LAB — POCT URINE PREGNANCY: Preg Test, Ur: NEGATIVE

## 2018-04-02 MED ORDER — NORGESTIM-ETH ESTRAD TRIPHASIC 0.18/0.215/0.25 MG-25 MCG PO TABS: 1.0000 | ORAL_TABLET | Freq: Every day | ORAL | 11 refills | Status: DC

## 2018-04-02 MED ORDER — METRONIDAZOLE 500 MG PO TABS: 500.0000 mg | ORAL_TABLET | Freq: Two times a day (BID) | ORAL | 0 refills | Status: AC

## 2018-04-02 NOTE — Patient Instructions (Signed)
It was nice seeing you today. We completed your PAP and wet prep. We will call with the result.   I highly recommend repeat Syphilis check today. However, since you decline, please schedule lab appointment soon.  Also schedule Flu shot and Tdap vaccination soon.  Thanks.

## 2018-04-02 NOTE — Progress Notes (Signed)
Patient ID: Stacey [name], female   DOB: 1995-08-09, 22 y.o.   MRN: [id number] Subjective:     Stacey [name] is a 22 y.o. female here for a routine exam.  Current complaints: hx of unprotected sex with same partner.  Personal health questionnaire reviewed: no.   Gynecologic History Patient's last menstrual period was 03/14/2018. Contraception: none. Recent unprotected sex, hence she will like to get checked. She has been cramping and nausea. She is worried about pregnancy. Last Pap: None.  Last mammogram: NA  Obstetric History OB History  No data available     The following portions of the patient's history were reviewed and updated as appropriate: allergies, current medications, past family history, past medical history, past social history, past surgical history and problem list.  Review of Systems Pertinent items noted in HPI and remainder of comprehensive ROS otherwise negative.    Objective:    BP 110/70   Pulse (!) 101   Temp 98.2 F (36.8 C) (Oral)   Wt 142 lb (64.4 kg)   LMP 03/14/2018   SpO2 97%   BMI 22.24 kg/m  General appearance: alert and cooperative Lungs: clear to auscultation bilaterally Heart: regular rate and rhythm, S1, S2 normal, no murmur, click, rub or gallop Abdomen: soft, non-tender; bowel sounds normal; no masses,  no organomegaly Pelvic: cervix normal in appearance, external genitalia normal, no adnexal masses or tenderness, no cervical motion tenderness, uterus normal size, shape, and consistency and + whitish discharge Extremities: extremities normal, atraumatic, no cyanosis or edema Neurologic: Grossly normal    Assessment:    Healthy female exam.    Plan:    Education reviewed: safe sex/STD prevention. Contraception: none. Follow up in: prn for health issue.     Flu shot and Tdap offered. She declined both.   Need for repeat RPR and Trep antibody discussed due to previously positive test. She declined. She stated that  she does not want blood work on injections today.   Wet pre completed for vaginitis: + BV. I called and discussed result with her. Meds sent to her pharm.  Upreg negative. Birth control prescribed. Although she smokes, she is less than 35 yrs, hence ok to start OCP.

## 2018-04-04 ENCOUNTER — Encounter: Payer: Self-pay | Admitting: Family Medicine

## 2018-04-04 LAB — CYTOLOGY - PAP
Chlamydia: NEGATIVE
Diagnosis: NEGATIVE
NEISSERIA GONORRHEA: NEGATIVE

## 2018-06-18 ENCOUNTER — Inpatient Hospital Stay (HOSPITAL_COMMUNITY)
Admission: AD | Admit: 2018-06-18 | Discharge: 2018-06-18 | Disposition: A | Discharge status: Home / Self Care | Payer: Medicaid Other | Attending: Obstetrics and Gynecology | Admitting: Obstetrics and Gynecology

## 2018-06-18 DIAGNOSIS — F17200 Nicotine dependence, unspecified, uncomplicated: Secondary | ICD-10-CM | POA: Diagnosis not present

## 2018-06-18 DIAGNOSIS — N912 Amenorrhea, unspecified: Secondary | ICD-10-CM | POA: Diagnosis present

## 2018-06-18 DIAGNOSIS — Z880 Allergy status to penicillin: Secondary | ICD-10-CM | POA: Insufficient documentation

## 2018-06-18 DIAGNOSIS — Z3200 Encounter for pregnancy test, result unknown: Secondary | ICD-10-CM

## 2018-06-18 NOTE — MAU Note (Signed)
Pt has symptoms of her usual period. Didn't get period and took a test and it was positive. Not on any birth control. LMP 11/18-20/19. No pain or bleeding.

## 2018-06-18 NOTE — L&D Delivery Note (Addendum)
Patient is 23 y.o. [id number] [redacted]w[redacted]d admitted in active labor   Delivery Note At 8:34 PM a viable female was delivered via Vaginal, Spontaneous (Presentation: cephalic; LOA).   Placenta status: spntaneous, intact.  Cord: 3VC.   Anesthesia:  epidural Episiotomy: None Lacerations: 1st degree Suture Repair: Monocryl, 4-0 Est. Blood Loss (mL): 328  Mom to postpartum.  Baby to Couplet care / Skin to Skin.  Upon arrival patient was complete and pushing. She pushed with good maternal effort to deliver a healthy baby boy. Baby delivered without difficulty, was noted to have good tone and place on maternal abdomen for oral suctioning, drying and stimulation. Delayed cord clamping performed. Placenta delivered intact with 3V cord. Vaginal canal and perineum was inspected and found to have a left sided first degree vaginal wall laceration which was sutured and left hemostatic. Pitocin was started and uterus massaged until bleeding slowed. Counts of sharps, instruments, and lap pads were all correct.   Matilde Haymaker, MD PGY-2 8/22/20209:06 PM   I was present and gloved for the entire delivery with the resident. I agree with the note above.   Marcille Buffy DNP, CNM  02/07/19  9:14 PM

## 2018-06-18 NOTE — MAU Provider Note (Signed)
Chief Complaint: Possible Pregnancy   First Provider Initiated Contact with Patient 06/18/18 1259      SUBJECTIVE HPI: Stacey [name] is a 23 y.o. who presents to maternity admissions for pregnancy confirmation. She denies vaginal bleeding or abdominal pain today.   Past Medical History:  Diagnosis Date  . ADHD (attention deficit hyperactivity disorder)   . Mood disorder (HCC)   . ODD (oppositional defiant disorder)   . PTSD (post-traumatic stress disorder)   . 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 Social History   Socioeconomic History  . Marital status: Single    Spouse name: Not on file  . Number of children: Not on file  . Years of education: Not on file  . Highest education level: Not on file  Occupational History  . Not on file  Social Needs  . Financial resource strain: Not on file  . Food insecurity:    Worry: Not on file    Inability: Not on file  . Transportation needs:    Medical: Not on file    Non-medical: Not on file  Tobacco Use  . Smoking status: Current Every Day Smoker  . Smokeless tobacco: Never Used  Substance and Sexual Activity  . Alcohol use: No    Alcohol/week: 0.0 standard drinks  . Drug use: Yes    Types: Marijuana  . Sexual activity: Yes    Birth control/protection: OCP  Lifestyle  . Physical activity:    Days per week: Not on file    Minutes per session: Not on file  . Stress: Not on file  Relationships  . Social connections:    Talks on phone: Not on file    Gets together: Not on file    Attends religious service: Not on file    Active member of club or organization: Not on file    Attends meetings of clubs or organizations: Not on file    Relationship status: Not on file  . Intimate partner violence:    Fear of current or ex partner: Not on file    Emotionally abused: Not on file    Physically abused: Not on file    Forced sexual activity: Not on file  Other Topics  Concern  . 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    Current Outpatient Medications on File Prior to Encounter  Medication Sig Dispense Refill  . fluticasone (FLONASE) 50 MCG/ACT nasal spray Place 2 sprays into both nostrils daily. (Patient not taking: Reported on 04/02/2018) 16 g 6  . loratadine (CLARITIN) 10 MG tablet Take 1 tablet (10 mg total) by mouth daily. (Patient not taking: Reported on 04/02/2018) 30 tablet 11  . Norgestimate-Ethinyl Estradiol Triphasic 0.18/0.215/0.25 MG-25 MCG tab Take 1 tablet by mouth daily. 1 Package 11   Allergies  Allergen Reactions  . Penicillins Other (See Comments)    Has patient had a PCN reaction causing immediate rash, facial/tongue/throat swelling, SOB or lightheadedness with hypotension: No Has patient had a PCN reaction causing severe rash involving mucus membranes or skin necrosis: No Has patient had a PCN reaction that required hospitalization No Has pt had a PCN reaction in the last 10 years? No. Nearly universal familial allergy. Pt has never taken PCN.     ROS:  Review of Systems  Constitutional: Negative.  Negative  for fatigue and fever.  HENT: Negative.   Respiratory: Negative.  Negative for shortness of breath.   Cardiovascular: Negative.  Negative for chest pain.  Gastrointestinal: Negative.  Negative for abdominal pain, constipation, diarrhea, nausea and vomiting.  Genitourinary: Negative.  Negative for dysuria.  Neurological: Negative.  Negative for dizziness and headaches.    I have reviewed patient's Past Medical Hx, Surgical Hx, Family Hx, Social Hx, medications and allergies.   Physical Exam   Physical Exam- not indicated  MDM Patient denies any concerning symptoms for ectopic. She has been advised that UPT will not be performed today strictly for pregnancy confirmation  and that she may present to the [facility] during business hours for a pregnancy test.   ASSESSMENT 1. Possible pregnancy     PLAN Discharge home in stable condition First trimester/ectopic precautions discussed Patient advised to follow-up with [facility] for pregnancy test Patient may return to MAU as needed or if her condition were to change or worsen   Rolm Bookbinder, [place] 06/18/2018 1:03 PM

## 2018-06-19 ENCOUNTER — Ambulatory Visit (INDEPENDENT_AMBULATORY_CARE_PROVIDER_SITE_OTHER): Payer: Medicaid Other | Admitting: General Practice

## 2018-06-19 ENCOUNTER — Encounter: Payer: Self-pay | Admitting: Family Medicine

## 2018-06-19 DIAGNOSIS — Z3201 Encounter for pregnancy test, result positive: Secondary | ICD-10-CM

## 2018-06-19 LAB — POCT PREGNANCY, URINE: Preg Test, Ur: POSITIVE — AB

## 2018-06-19 MED ORDER — PRENATAL VITAMINS 0.8 MG PO TABS: 1.0000 | ORAL_TABLET | Freq: Every day | ORAL | 12 refills | Status: DC

## 2018-06-19 NOTE — Progress Notes (Signed)
Agree with A & P. 

## 2018-06-19 NOTE — Progress Notes (Signed)
Patient presents to office today for UPT. UPT +. Patient reports first positive home test 06/17/18. LMP 05/10/18 EDD 02/14/19 [id number]. Reports taking hair, skin, & nails vitamin as well as a one-day women's vitamin. Rx for PNV sent to pharmacy and recommended she begin OB care. Patient verbalized understanding to all & had no questions at this time.  Chase Callerarrie H RN BSN 06/19/18

## 2018-06-23 ENCOUNTER — Other Ambulatory Visit: Payer: Medicaid Other

## 2018-06-23 DIAGNOSIS — Z3490 Encounter for supervision of normal pregnancy, unspecified, unspecified trimester: Secondary | ICD-10-CM

## 2018-06-25 LAB — CULTURE, OB URINE

## 2018-06-25 LAB — URINE CULTURE, OB REFLEX: Organism ID, Bacteria: NO GROWTH

## 2018-06-26 LAB — OBSTETRIC PANEL, INCLUDING HIV
Antibody Screen: NEGATIVE
Basophils Absolute: 0 10*3/uL (ref 0.0–0.2)
Basos: 0 %
EOS (ABSOLUTE): 0 10*3/uL (ref 0.0–0.4)
EOS: 0 %
HEMOGLOBIN: 12 g/dL (ref 11.1–15.9)
HEP B S AG: NEGATIVE
HIV Screen 4th Generation wRfx: NONREACTIVE
Hematocrit: 35.6 % (ref 34.0–46.6)
IMMATURE GRANULOCYTES: 0 %
Immature Grans (Abs): 0 10*3/uL (ref 0.0–0.1)
Lymphocytes Absolute: 1.6 10*3/uL (ref 0.7–3.1)
Lymphs: 14 %
MCH: 29.5 pg (ref 26.6–33.0)
MCHC: 33.7 g/dL (ref 31.5–35.7)
MCV: 88 fL (ref 79–97)
Monocytes Absolute: 0.7 10*3/uL (ref 0.1–0.9)
Monocytes: 6 %
NEUTROS PCT: 80 %
Neutrophils Absolute: 9.3 10*3/uL — ABNORMAL HIGH (ref 1.4–7.0)
PLATELETS: 281 10*3/uL (ref 150–450)
RBC: 4.07 x10E6/uL (ref 3.77–5.28)
RDW: 12.3 % (ref 11.7–15.4)
RPR Ser Ql: NONREACTIVE
Rh Factor: POSITIVE
Rubella Antibodies, IGG: 4.47 index (ref >0.99)
WBC: 11.7 10*3/uL — ABNORMAL HIGH (ref 3.4–10.8)

## 2018-06-26 LAB — HGB FRAC. W/SOLUBILITY
HGB A2 QUANT: 2.2 % (ref 1.8–3.2)
Hgb A: 97.8 % (ref 96.4–98.8)
Hgb C: 0 %
Hgb F Quant: 0 % (ref 0.0–2.0)
Hgb S: 0 %
Hgb Solubility: NEGATIVE
Hgb Variant: 0 %

## 2018-06-30 ENCOUNTER — Other Ambulatory Visit (HOSPITAL_COMMUNITY)
Admission: RE | Admit: 2018-06-30 | Discharge: 2018-06-30 | Disposition: A | Discharge status: Home / Self Care | Payer: Medicaid Other | Source: Ambulatory Visit | Attending: Family Medicine | Admitting: Family Medicine

## 2018-06-30 ENCOUNTER — Ambulatory Visit (INDEPENDENT_AMBULATORY_CARE_PROVIDER_SITE_OTHER): Payer: Self-pay | Admitting: Family Medicine

## 2018-06-30 ENCOUNTER — Encounter: Payer: Self-pay | Admitting: Family Medicine

## 2018-06-30 ENCOUNTER — Other Ambulatory Visit: Payer: Self-pay

## 2018-06-30 VITALS — BP 104/58 | HR 98 | Temp 98.5°F | Wt 143.4 lb

## 2018-06-30 DIAGNOSIS — Z3491 Encounter for supervision of normal pregnancy, unspecified, first trimester: Secondary | ICD-10-CM

## 2018-06-30 DIAGNOSIS — Z3A01 Less than 8 weeks gestation of pregnancy: Secondary | ICD-10-CM | POA: Diagnosis not present

## 2018-06-30 NOTE — Patient Instructions (Signed)
Everything looks great.  Please come back in 4 weeks for your next prenatal visit.  Your ultrasound has been scheduled already.    Have a great day,   Frederic Jericho, MD

## 2018-06-30 NOTE — Progress Notes (Signed)
Stacey [name] is a 23 y.o. yo G3P0020 at [redacted]w[redacted]d by LMP who presents for her initial prenatal visit. She has previously had one elective and one spontaneous abortion (first trimester).  Father of baby, Fredderick Erb, is here today as well.   Pregnancy  is notplanned.  Patient has been off birth control for a long time and having unprotected sex.    She reports breast tenderness, fatigue, frequent urination, morning sickness, positive home pregnancy test and uses a c-band for morning sickness. .  She  isTaking PNV. Hello bello chewable.  No other medications.   Patient's last menstrual period was November 19th 2019 but was described only as 2 days of spotting.   Before that LMP was oct 11th.    Patient states she had No major medical issues prior to becoming pregnant and was on No medications.  Patient states she may be allergic to chocolate, but no other known allergies.    Patient quit smoking cigarettes and marijuana when she found out she was pregnant.  She stated she will now take maybe one drag off a cigarette per day. She Doesn't drink alcohol.    She states she was diagnosed with a mood disorder when she was younger but has not taken medication for it in a long time.   PMH, POBH, FH, meds, allergies and Social Hx reviewed.  BP (!) 104/58   Pulse 98   Temp 98.5 F (36.9 C)   Wt 143 lb 6.4 oz (65 kg)   LMP 05/05/2018 (LMP Unknown)   BMI 22.46 kg/m   Prenatal exam:Gen: Well nourished, well developed.  No distress.  Vitals noted. HEENT: Normocephalic, atraumatic.  Neck supple without cervical lymphadenopathy, thyromegaly or thyroid nodules.  fair dentition. CV: RRR no murmur, gallops or rubs Lungs: LCTAB.  Normal respiratory effort without wheezes or rales. Abd: soft, NTND. +BS.  Uterus not appreciated above pelvis. GU: Normal external female genitalia without lesions.  Nl vaginal, well rugated without lesions. No vaginal discharge.  Bimanual exam: No adnexal mass or TTP. No CMT.   Ext: No clubbing, cyanosis or edema. Psych: Normal grooming and dress.  Not depressed or anxious appearing.  Normal thought content and process without flight of ideas or looseness of associations    Assessment/plan: 1) Pregnancy doing well.  Current pregnancy issues include tobacco use/marijuana use. She has stated she quit when she discovered she was pregnant.  Also concern she is at high risk for depression as her chart indicates a previous suicide attempt and family history of depression on both sides.  Her mood was normal to elated during this encounter.  Dating is reliable Prenatal labs reviewed, nothing abnormal.  Bleeding and pain precautions reviewed. Importance of prenatal vitamins reviewed.  Genetic screening offered. Patient wants genetic screening.  Early glucola is not indicated.    Follow up 4 weeks.

## 2018-07-01 LAB — CERVICOVAGINAL ANCILLARY ONLY
Chlamydia: NEGATIVE
Neisseria Gonorrhea: NEGATIVE

## 2018-07-02 DIAGNOSIS — Z3491 Encounter for supervision of normal pregnancy, unspecified, first trimester: Secondary | ICD-10-CM | POA: Insufficient documentation

## 2018-07-02 NOTE — Addendum Note (Signed)
Addended by: Sandre Kitty on: 07/02/2018 03:03 PM   Modules accepted: Kipp Brood

## 2018-07-07 ENCOUNTER — Ambulatory Visit (HOSPITAL_COMMUNITY)
Admission: RE | Admit: 2018-07-07 | Discharge: 2018-07-07 | Disposition: A | Discharge status: Home / Self Care | Payer: Medicaid Other | Source: Ambulatory Visit | Attending: Family Medicine | Admitting: Family Medicine

## 2018-07-07 DIAGNOSIS — O26841 Uterine size-date discrepancy, first trimester: Secondary | ICD-10-CM | POA: Diagnosis not present

## 2018-07-07 DIAGNOSIS — Z3A01 Less than 8 weeks gestation of pregnancy: Secondary | ICD-10-CM | POA: Diagnosis not present

## 2018-07-15 ENCOUNTER — Encounter (HOSPITAL_COMMUNITY): Payer: Self-pay | Admitting: *Deleted

## 2018-07-15 ENCOUNTER — Inpatient Hospital Stay (HOSPITAL_COMMUNITY)
Admission: AD | Admit: 2018-07-15 | Discharge: 2018-07-15 | Disposition: A | Discharge status: Home / Self Care | Payer: Medicaid Other | Attending: Obstetrics and Gynecology | Admitting: Obstetrics and Gynecology

## 2018-07-15 ENCOUNTER — Other Ambulatory Visit: Payer: Self-pay

## 2018-07-15 DIAGNOSIS — O209 Hemorrhage in early pregnancy, unspecified: Secondary | ICD-10-CM | POA: Diagnosis not present

## 2018-07-15 DIAGNOSIS — O26899 Other specified pregnancy related conditions, unspecified trimester: Secondary | ICD-10-CM

## 2018-07-15 DIAGNOSIS — O26891 Other specified pregnancy related conditions, first trimester: Secondary | ICD-10-CM

## 2018-07-15 DIAGNOSIS — O4691 Antepartum hemorrhage, unspecified, first trimester: Secondary | ICD-10-CM | POA: Diagnosis not present

## 2018-07-15 DIAGNOSIS — R109 Unspecified abdominal pain: Secondary | ICD-10-CM

## 2018-07-15 DIAGNOSIS — Z3A09 9 weeks gestation of pregnancy: Secondary | ICD-10-CM | POA: Insufficient documentation

## 2018-07-15 DIAGNOSIS — O4690 Antepartum hemorrhage, unspecified, unspecified trimester: Secondary | ICD-10-CM

## 2018-07-15 HISTORY — DX: Attention-deficit hyperactivity disorder, unspecified type: F90.9

## 2018-07-15 HISTORY — DX: Depression, unspecified: F32.A

## 2018-07-15 HISTORY — DX: Major depressive disorder, single episode, unspecified: F32.9

## 2018-07-15 LAB — URINALYSIS, ROUTINE W REFLEX MICROSCOPIC
Bilirubin Urine: NEGATIVE
Glucose, UA: NEGATIVE mg/dL
Hgb urine dipstick: NEGATIVE
Ketones, ur: 80 mg/dL — AB
Leukocytes, UA: NEGATIVE
Nitrite: NEGATIVE
PH: 7 (ref 5.0–8.0)
Protein, ur: NEGATIVE mg/dL
Specific Gravity, Urine: 1.025 (ref 1.005–1.030)

## 2018-07-15 NOTE — MAU Provider Note (Signed)
History    First Provider Initiated Solicitor with Patient 07/15/18 1454      Chief Complaint:  Vaginal Bleeding and Abdominal Pain   Stacey [name] is  23 y.o. [id number] Patient's last menstrual period was 05/10/2018.Marland Kitchen Patient is here for small bout of vaginal bleeding and cramping since yesterday, 1/27.   She is [redacted]w[redacted]d weeks gestation  by LMP, early ultrasound.    ROS Abdominal Pain: mild cramping Vaginal bleeding: none now and lighter than period.   Passage of clots or tissue: Denies Dizziness: denies  O Pos  Previous US 06/30/18 showed live SIUP.   Physical Exam   Patient Vitals for the past 24 hrs:  BP Temp Temp src Pulse Resp SpO2 Height Weight  07/15/18 1442 105/61 98.5 F (36.9 C) Oral 95 18 100 % - -  07/15/18 1433 - - - - - - 5\' 6"  (1.676 m) 65 kg   Constitutional: Well-nourished female in no apparent distress. No pallor Neuro: Alert and oriented 4 Cardiovascular: Normal rate Respiratory: Normal effort and rate Abdomen: Soft, nontender Gynecological Exam: normal external genitalia, vulva, vagina, cervix, uterus and adnexa. Scant curdlike, discharge. Cervix long and closed. No CMT.   Labs: No results found for this or any previous visit (from the past 24 hour(s)).  Ultrasound Studies:   Informal BS US shows Live SIUP, FHR 171.  MAU course/MDM: BS [place] Orders Placed This Encounter  Procedures  . Urinalysis, Routine w reflex microscopic   Pain and bleeding in early pregnancy with normal [place] and hemodynamically stable. No bleeding on exam. Unknown etiology for bleeding.   Assessment: 1. Vaginal bleeding in pregnant patient after first trimester   2. Cramping complicating pregnancy, antepartum    Plan: Discharge home in stable condition. First trimester precautions Follow-up Information    Doreene Eland, MD Follow up on 08/05/2018.   Specialty:  Family Medicine Why:  as scheduled or sooner as needed if symptoms worsen Contact information: [address] [place] [address] [phone]        [facility] Follow up.   Specialty:  Obstetrics and Gynecology Why:  in pregnancy emergencies Contact information: [address] [id number] mc [place] [address] [phone]         Allergies as of 07/15/2018      Reactions   Penicillins Other (See Comments)   Has patient had a PCN reaction causing immediate rash, facial/tongue/throat swelling, SOB or lightheadedness with hypotension: No Has patient had a PCN reaction causing severe rash involving mucus membranes or skin necrosis: No Has patient had a PCN reaction that required hospitalization No Has pt had a PCN reaction in the last 10 years? No. Nearly universal familial allergy. Pt has never taken PCN.       Medication List    STOP taking these medications   fluticasone 50 MCG/ACT nasal spray Commonly known as:  FLONASE   loratadine 10 MG tablet Commonly known as:  CLARITIN     TAKE these medications   Prenatal Vitamins 0.8 MG tablet Take 1 tablet by mouth daily.       Katrinka Blazing, [place], CNM 07/15/2018, 3:39 PM  2/3

## 2018-07-15 NOTE — MAU Note (Signed)
Presents with c/o VB with wiping last night and abdominal cramping x2 days.  Reports currently not bleeding.

## 2018-07-15 NOTE — Discharge Instructions (Signed)
Vaginal Bleeding During Pregnancy, First Trimester ° °A small amount of bleeding from the vagina (spotting) is relatively common during early pregnancy. It usually stops on its own. Various things may cause bleeding or spotting during early pregnancy. Some bleeding may be related to the pregnancy, and some may not. In many cases, the bleeding is normal and is not a problem. However, bleeding can also be a sign of something serious. Be sure to tell your health care provider about any vaginal bleeding right away. °Some possible causes of vaginal bleeding during the first trimester include: °· Infection or inflammation of the cervix. °· Growths (polyps) on the cervix. °· Miscarriage or threatened miscarriage. °· Pregnancy tissue developing outside of the uterus (ectopic pregnancy). °· A mass of tissue developing in the uterus due to an egg being fertilized incorrectly (molar pregnancy). °Follow these instructions at home: °Activity °· Follow instructions from your health care provider about limiting your activity. Ask what activities are safe for you. °· If needed, make plans for someone to help with your regular activities. °· Do not have sex or orgasms until your health care provider says that this is safe. °General instructions °· Take over-the-counter and prescription medicines only as told by your health care provider. °· Pay attention to any changes in your symptoms. °· Do not use tampons or douche. °· Write down how many pads you use each day, how often you change pads, and how soaked (saturated) they are. °· If you pass any tissue from your vagina, save the tissue so you can show it to your health care provider. °· Keep all follow-up visits as told by your health care provider. This is important. °Contact a health care provider if: °· You have vaginal bleeding during any part of your pregnancy. °· You have cramps or labor pains. °· You have a fever. °Get help right away if: °· You have severe cramps in your  back or abdomen. °· You pass large clots or a large amount of tissue from your vagina. °· Your bleeding increases. °· You feel light-headed or weak, or you faint. °· You have chills. °· You are leaking fluid or have a gush of fluid from your vagina. °Summary °· A small amount of bleeding (spotting) from the vagina is relatively common during early pregnancy. °· Various things may cause bleeding or spotting in early pregnancy. °· Be sure to tell your health care provider about any vaginal bleeding right away. °This information is not intended to replace advice given to you by your health care provider. Make sure you discuss any questions you have with your health care provider. °Document Released: 03/14/2005 Document Revised: 09/06/2016 Document Reviewed: 09/06/2016 °Elsevier Interactive Patient Education © 2019 Elsevier Inc. ° °

## 2018-08-05 ENCOUNTER — Telehealth: Payer: Self-pay | Admitting: Family Medicine

## 2018-08-05 ENCOUNTER — Encounter: Payer: Self-pay | Admitting: Family Medicine

## 2018-08-05 ENCOUNTER — Ambulatory Visit (INDEPENDENT_AMBULATORY_CARE_PROVIDER_SITE_OTHER): Payer: Medicaid Other | Admitting: Licensed Clinical Social Worker

## 2018-08-05 ENCOUNTER — Ambulatory Visit (INDEPENDENT_AMBULATORY_CARE_PROVIDER_SITE_OTHER): Payer: Medicaid Other | Admitting: Family Medicine

## 2018-08-05 VITALS — BP 112/60 | HR 99 | Temp 99.4°F | Wt 143.2 lb

## 2018-08-05 DIAGNOSIS — F322 Major depressive disorder, single episode, severe without psychotic features: Secondary | ICD-10-CM | POA: Diagnosis not present

## 2018-08-05 DIAGNOSIS — R627 Adult failure to thrive: Secondary | ICD-10-CM

## 2018-08-05 DIAGNOSIS — Z3491 Encounter for supervision of normal pregnancy, unspecified, first trimester: Secondary | ICD-10-CM | POA: Diagnosis not present

## 2018-08-05 DIAGNOSIS — N898 Other specified noninflammatory disorders of vagina: Secondary | ICD-10-CM

## 2018-08-05 DIAGNOSIS — F4321 Adjustment disorder with depressed mood: Secondary | ICD-10-CM

## 2018-08-05 LAB — POCT WET PREP (WET MOUNT)
Clue Cells Wet Prep Whiff POC: NEGATIVE
Trichomonas Wet Prep HPF POC: ABSENT

## 2018-08-05 MED ORDER — TERCONAZOLE 0.4 % VA CREA: 1.0000 | TOPICAL_CREAM | Freq: Every day | VAGINAL | 0 refills | Status: AC

## 2018-08-05 NOTE — Telephone Encounter (Signed)
HIPAA compliant callback message left on both phone listed.   Note: Positive yeast vaginitis. Terconazole has been escribed to her pharmacy.  Please discuss result with her and instruction above if she calls back.

## 2018-08-05 NOTE — Patient Instructions (Signed)
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
## 2018-08-05 NOTE — BH Specialist Note (Signed)
Integrated Behavioral Health Initial Visit  MRN: [id number] Name: Ernesto Thwaites Kurihara  Session Start time: 11:10  Session End time: 11:30 Total time: 20 minutes Type of Service: Integrated Behavioral Health  Warm Hand Off Completed.     Patient verbally consented to meet with [facility] Consultant about presenting concerns. SUBJECTIVE: Merit C Zuba is a 23 y.o. female referred by Dr. Lum Babe for assistance with managing symptoms of depression.  PHQ-9 of 19 with no SI. ASSESSMENT: Mood: Depressed and Affect: Appropriate and feeling down and tearful at times Patient is currently experiencing symptoms of depression which are exacerbated by reaction to an unwanted pregnancy, recent relationship break-up and financial stressors.  Patient may benefit from and is in agreement to explore resources discussed today as well as reconnecting to her therapist for assistance with managing her symptom.  GOALS: Patient will: 1. Reduce symptoms of: depression 2. Increase knowledge and/or ability of: coping skills and self-management skills  Plan 1. Patient will contact [facility] 2. Apply for pregnancy Medicaid 3. Will consider contacting her therapist Emerson Monte.  _______________________________________________________  Psychiatric History - Diagnoses:mood disorder - Pharmacotherapy: none - Outpatient therapy: [facility] last year, on and off since 23 years old Risk of harm to self or others: denies SI, hx of SI in the past  LIFE CONTEXT: Family and Social: lives alone,  family lives local, not supportive of her thinking of adoption or termination of pregnancy  School/Work: unemployed at this time, looking for employment. Life Changes: unemployed, relationship breakup and unwanted pregnancy  INTERVENTION: Patient interviewed and appropriate assessments performed, Provided patient with information about pregnacy resources and Advised patient to  contact [facility] ; Additional Interventions: Motivational interviewing, supportive counseling and Community Resources    Tamela Gammon [facility]   [phone] 12:10 PM

## 2018-08-12 ENCOUNTER — Telehealth: Payer: Self-pay | Admitting: Licensed Clinical Social Worker

## 2018-08-12 NOTE — Telephone Encounter (Signed)
   Outreach Note  08/12/2018 Name: Stacey [name] MRN: [id number] DOB: 1996-03-11  Referred by: Doreene Eland, MD Reason for referral : Other ([facility] F/U call) An unsuccessful telephone outreach to patient was attempted today to see if she connected with resources provided during office with  PCP. Patient was referred for assistance with managing symptoms of depression due to financial stressor and adjustment with unplanned pregnancy.  See  Previous note with detailed plan.  Follow Up Plan:LCSW will call again in 3 to7 days. If no return call is received.  Sammuel Hines, LCSW [facility]   [phone] 11:51 AM

## 2018-08-15 ENCOUNTER — Telehealth: Payer: Self-pay | Admitting: Licensed Clinical Social Worker

## 2018-08-15 NOTE — Telephone Encounter (Signed)
  08/15/2018 Name: Stacey [name] MRN: [id number] DOB: Sep 15, 1995  Referred by: PCP, Dr.Eniola for resources with managing symptoms of depression due to financial stressor and adjustment with unplanned pregnancy  2nd F/U call to Stacey [name] reference the above referral.  Patient has not followed up with pregnancy resources provided. She has decided to keep her baby. She is no longer interested in adoption or abortion resources. Informed patient resources provided will also assist and support her during her pregnancy.  Also informed patient that LCSW is available if she would like to talk when she comes in for her OB appointments.  Patient did not agree or make a commitment to contact the resources.  Intervention:. Provided client with information on [facility].  Plan: Patient will contact her previous therapist if needed.   Sammuel Hines, LCSW [facility]   [phone] 10:46 AM

## 2018-08-17 ENCOUNTER — Inpatient Hospital Stay (HOSPITAL_BASED_OUTPATIENT_CLINIC_OR_DEPARTMENT_OTHER): Payer: Medicaid Other

## 2018-08-17 ENCOUNTER — Inpatient Hospital Stay (HOSPITAL_COMMUNITY): Payer: Medicaid Other

## 2018-08-17 ENCOUNTER — Inpatient Hospital Stay (HOSPITAL_COMMUNITY)
Admission: AD | Admit: 2018-08-17 | Discharge: 2018-08-17 | Disposition: A | Discharge status: Home / Self Care | Payer: Medicaid Other | Source: Ambulatory Visit | Attending: Obstetrics & Gynecology | Admitting: Obstetrics & Gynecology

## 2018-08-17 ENCOUNTER — Encounter (HOSPITAL_COMMUNITY): Payer: Self-pay | Admitting: *Deleted

## 2018-08-17 DIAGNOSIS — O99612 Diseases of the digestive system complicating pregnancy, second trimester: Secondary | ICD-10-CM | POA: Insufficient documentation

## 2018-08-17 DIAGNOSIS — F17200 Nicotine dependence, unspecified, uncomplicated: Secondary | ICD-10-CM | POA: Insufficient documentation

## 2018-08-17 DIAGNOSIS — O210 Mild hyperemesis gravidarum: Secondary | ICD-10-CM | POA: Diagnosis not present

## 2018-08-17 DIAGNOSIS — Z3A14 14 weeks gestation of pregnancy: Secondary | ICD-10-CM

## 2018-08-17 DIAGNOSIS — Z880 Allergy status to penicillin: Secondary | ICD-10-CM | POA: Insufficient documentation

## 2018-08-17 DIAGNOSIS — R109 Unspecified abdominal pain: Secondary | ICD-10-CM | POA: Insufficient documentation

## 2018-08-17 DIAGNOSIS — M549 Dorsalgia, unspecified: Secondary | ICD-10-CM | POA: Diagnosis present

## 2018-08-17 DIAGNOSIS — O99332 Smoking (tobacco) complicating pregnancy, second trimester: Secondary | ICD-10-CM | POA: Insufficient documentation

## 2018-08-17 DIAGNOSIS — O26892 Other specified pregnancy related conditions, second trimester: Secondary | ICD-10-CM

## 2018-08-17 DIAGNOSIS — K219 Gastro-esophageal reflux disease without esophagitis: Secondary | ICD-10-CM

## 2018-08-17 DIAGNOSIS — O26899 Other specified pregnancy related conditions, unspecified trimester: Secondary | ICD-10-CM | POA: Diagnosis present

## 2018-08-17 LAB — CBC WITH DIFFERENTIAL/PLATELET
Abs Immature Granulocytes: 0.18 10*3/uL — ABNORMAL HIGH (ref 0.00–0.07)
Basophils Absolute: 0.1 10*3/uL (ref 0.0–0.1)
Basophils Relative: 0 %
EOS PCT: 0 %
Eosinophils Absolute: 0.1 10*3/uL (ref 0.0–0.5)
HCT: 38 % (ref 36.0–46.0)
Hemoglobin: 12.7 g/dL (ref 12.0–15.0)
Immature Granulocytes: 1 %
LYMPHS ABS: 0.6 10*3/uL — AB (ref 0.7–4.0)
Lymphocytes Relative: 4 %
MCH: 30 pg (ref 26.0–34.0)
MCHC: 33.4 g/dL (ref 30.0–36.0)
MCV: 89.8 fL (ref 80.0–100.0)
Monocytes Absolute: 0.8 10*3/uL (ref 0.1–1.0)
Monocytes Relative: 5 %
Neutro Abs: 13.9 10*3/uL — ABNORMAL HIGH (ref 1.7–7.7)
Neutrophils Relative %: 90 %
Platelets: 272 10*3/uL (ref 150–400)
RBC: 4.23 MIL/uL (ref 3.87–5.11)
RDW: 13.7 % (ref 11.5–15.5)
WBC: 15.5 10*3/uL — ABNORMAL HIGH (ref 4.0–10.5)
nRBC: 0 % (ref 0.0–0.2)

## 2018-08-17 LAB — WET PREP, GENITAL
Clue Cells Wet Prep HPF POC: NONE SEEN
Sperm: NONE SEEN
Trich, Wet Prep: NONE SEEN
Yeast Wet Prep HPF POC: NONE SEEN

## 2018-08-17 LAB — COMPREHENSIVE METABOLIC PANEL
ALBUMIN: 3.9 g/dL (ref 3.5–5.0)
ALT: 11 U/L (ref 0–44)
AST: 16 U/L (ref 15–41)
Alkaline Phosphatase: 73 U/L (ref 38–126)
Anion gap: 9 (ref 5–15)
BUN: <5 mg/dL — ABNORMAL LOW (ref 6–20)
CALCIUM: 9.2 mg/dL (ref 8.9–10.3)
CO2: 23 mmol/L (ref 22–32)
Chloride: 101 mmol/L (ref 98–111)
Creatinine, Ser: 0.48 mg/dL (ref 0.44–1.00)
GFR calc Af Amer: >60 mL/min (ref >60)
GFR calc non Af Amer: >60 mL/min (ref >60)
Glucose, Bld: 86 mg/dL (ref 70–99)
POTASSIUM: 3.4 mmol/L — AB (ref 3.5–5.1)
Sodium: 133 mmol/L — ABNORMAL LOW (ref 135–145)
Total Bilirubin: 0.5 mg/dL (ref 0.3–1.2)
Total Protein: 7.5 g/dL (ref 6.5–8.1)

## 2018-08-17 LAB — URINALYSIS, ROUTINE W REFLEX MICROSCOPIC
Bilirubin Urine: NEGATIVE
Glucose, UA: NEGATIVE mg/dL
Hgb urine dipstick: NEGATIVE
Ketones, ur: 20 mg/dL — AB
Leukocytes,Ua: NEGATIVE
Nitrite: NEGATIVE
Protein, ur: NEGATIVE mg/dL
Specific Gravity, Urine: 1.009 (ref 1.005–1.030)
pH: 6 (ref 5.0–8.0)

## 2018-08-17 MED ORDER — FAMOTIDINE 20 MG PO TABS
20.0000 mg | ORAL_TABLET | Freq: Once | ORAL | Status: AC
  Administered 2018-08-17: 20 mg via ORAL
  Filled 2018-08-17: qty 1

## 2018-08-17 MED ORDER — METOCLOPRAMIDE HCL 10 MG PO TABS: 10.0000 mg | ORAL_TABLET | Freq: Four times a day (QID) | ORAL | 0 refills | Status: DC

## 2018-08-17 MED ORDER — HYDROMORPHONE HCL 1 MG/ML IJ SOLN
1.0000 mg | Freq: Once | INTRAMUSCULAR | Status: AC
  Administered 2018-08-17: 1 mg via INTRAMUSCULAR
  Filled 2018-08-17: qty 1

## 2018-08-17 MED ORDER — FAMOTIDINE 20 MG PO TABS: 20.0000 mg | ORAL_TABLET | Freq: Two times a day (BID) | ORAL | 0 refills | Status: DC

## 2018-08-17 MED ORDER — ONDANSETRON HCL 4 MG/2ML IJ SOLN
4.0000 mg | Freq: Once | INTRAMUSCULAR | Status: AC
  Administered 2018-08-17: 4 mg via INTRAMUSCULAR
  Filled 2018-08-17: qty 2

## 2018-08-17 NOTE — MAU Note (Signed)
Stacey [name] is a 23 y.o. at [redacted]w[redacted]d here in MAU reporting: started having pelvic and back pain last night, states it is lots of pressure and hurts really bad. No bleeding, no discharge, no LOF.  Onset of complaint: last night  Pain score: 10/10  Vitals:   08/17/18 1459  BP: 125/60  Pulse: (!) 119  Resp: 20  Temp: 98.6 F (37 C)  SpO2: 100%      Lab orders placed from triage: UA, pt unable to go to the bathroom at this time

## 2018-08-17 NOTE — MAU Provider Note (Signed)
History     CSN: [id number]  Arrival date and time: 08/17/18 1449   First Provider Initiated Contact with Patient 08/17/18 1520      Chief Complaint  Patient presents with  . Back Pain  . Pelvic Pain   HPI  Ms.  Stacey [name] is a 22 y.o. year old G47P0020 female at [redacted]w[redacted]d weeks gestation who presents to MAU reporting pelvic and back pain since last night (08/16/2018) - rated 10/10. She reports "lots of pressure that hurts really bad." She also complains of "lots of N/V." She states that she throws up all of the time when she eats. She also reports a "burning" in her stomach. She has not been Rx'd any medications for N/V. She denies VB, abnormal vaginal d/c or LOF.   Past Medical History:  Diagnosis Date  . ADHD   . ADHD (attention deficit hyperactivity disorder)   . Depression   . Mood disorder (HCC)   . ODD (oppositional defiant disorder)   . PTSD (post-traumatic stress disorder)   . PTSD (post-traumatic stress disorder)   . 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 NO PAST SURGERIES      Family History  Problem Relation Age of Onset  . Depression Mother        bipolar, ptsd  . ADD / ADHD Mother   . Depression Father        depression,ptsd  . ADD / ADHD Father   . ADD / ADHD Sister   . ADD / ADHD Brother   . Diabetes Paternal Grandmother   . Diabetes Paternal Grandfather   . Hyperlipidemia Paternal Grandfather     Social History   Tobacco Use  . Smoking status: Current Every Day Smoker  . Smokeless tobacco: Never Used  Substance Use Topics  . Alcohol use: No    Alcohol/week: 0.0 standard drinks  . Drug use: Not Currently    Types: Marijuana    Allergies:  Allergies  Allergen Reactions  . Penicillins Other (See Comments)    Has patient had a PCN reaction causing immediate rash, facial/tongue/throat swelling, SOB or lightheadedness with hypotension: No Has patient had a PCN  reaction causing severe rash involving mucus membranes or skin necrosis: No Has patient had a PCN reaction that required hospitalization No Has pt had a PCN reaction in the last 10 years? No. Nearly universal familial allergy. Pt has never taken PCN.     Medications Prior to Admission  Medication Sig Dispense Refill Last Dose  . Prenatal Multivit-Min-Fe-FA (PRENATAL VITAMINS) 0.8 MG tablet Take 1 tablet by mouth daily. 30 tablet 12     Review of Systems  Constitutional: Negative.   HENT: Negative.   Eyes: Negative.   Respiratory: Negative.   Cardiovascular: Negative.   Gastrointestinal: Negative.   Endocrine: Negative.   Genitourinary: Positive for pelvic pain.  Musculoskeletal: Positive for back pain.  Skin: Negative.   Allergic/Immunologic: Negative.   Neurological: Negative.   Hematological: Negative.   Psychiatric/Behavioral: Negative.    Physical Exam   Blood pressure 125/60, pulse (!) 119, temperature 98.6 F (37 C), temperature source Oral, resp. rate 20, height  (1.702 m), weight 64.4 kg, last menstrual period 05/10/2018, SpO2 100 %.  Physical Exam  Nursing note and vitals reviewed. Constitutional: She is oriented to person, place, and time. She appears well-developed and well-nourished.  HENT:  Head: Normocephalic and atraumatic.  Eyes: Pupils are equal, round,  and reactive to light.  Neck: Normal range of motion.  Cardiovascular: Normal rate, regular rhythm, normal heart sounds and intact distal pulses.  Respiratory: Effort normal and breath sounds normal.  GI: Soft. Bowel sounds are normal.  Genitourinary:    Genitourinary Comments: Uterus: mildly tender, SE: cervix is smooth, pink, no lesions, small amt of thick, white vaginal d/c -- WP, GC/CT done, closed/long/firm, no CMT or friability, moderate bilateral adnexal tenderness   Musculoskeletal: Normal range of motion.  Neurological: She is alert and oriented to person, place, and time. She has normal  reflexes.  Skin: Skin is warm and dry.  Psychiatric: She has a normal mood and affect. Her behavior is normal. Judgment and thought content normal.    MAU Course  Procedures  MDM CCUA CBC w/Diff CMP OB MFM Limited U/S Dilaudid 1 mg IM -- resolved pain Zofran 4 mg IM -- resolved N/V Pepcid 20 mg -- resolved "burning in stomach" FHTs by doppler: 165 bpm  Results for orders placed or performed during the hospital encounter of 08/17/18 (from the past 24 hour(s))  Urinalysis, Routine w reflex microscopic     Status: Abnormal   Collection Time: 08/17/18  3:35 PM  Result Value Ref Range   Color, Urine YELLOW YELLOW   APPearance HAZY (A) CLEAR   Specific Gravity, Urine 1.009 1.005 - 1.030   pH 6.0 5.0 - 8.0   Glucose, UA NEGATIVE NEGATIVE mg/dL   Hgb urine dipstick NEGATIVE NEGATIVE   Bilirubin Urine NEGATIVE NEGATIVE   Ketones, ur 20 (A) NEGATIVE mg/dL   Protein, ur NEGATIVE NEGATIVE mg/dL   Nitrite NEGATIVE NEGATIVE   Leukocytes,Ua NEGATIVE NEGATIVE   [place] Mfm Ob Limited  Result Date: 08/17/2018 ----------------------------------------------------------------------  OBSTETRICS REPORT                       (Signed Final 08/17/2018 05:47 pm) ---------------------------------------------------------------------- Patient Info  ID #:       [id number]                          D.O.B.:  May 07, 1996 (22 yrs)  Name:       Stacey [name]                     Visit Date: 08/17/2018 04:31 pm              [name] ---------------------------------------------------------------------- Performed By  Performed By:     Birdena Crandall        Ref. Address:      [address]                                                              [place], [place]  [address]  Attending:        Lin [name]      [facility]  Referred By:      Stacey [name] CNM ---------------------------------------------------------------------- Orders   #  Description                          Code         Ordered By   1  [place] MFM OB LIMITED                    60454.09     [id number] Stacey [name]  ----------------------------------------------------------------------   #  Order #                    Accession #                 Episode #   1  [id number]                  [id number]                  [id number]  ---------------------------------------------------------------------- Indications   Pelvic pain affecting pregnancy in second      O26.892   trimester   [redacted] weeks gestation of pregnancy                Z3A.14  ---------------------------------------------------------------------- Fetal Evaluation  Num Of Fetuses:          1  Fetal Heart Rate(bpm):   171  Cardiac Activity:        Observed  Presentation:            Variable  Placenta:                Posterior  Amniotic Fluid  AFI FV:      Within normal limits                              Largest Pocket(cm)                              3.9 ---------------------------------------------------------------------- OB History  Gravidity:    3          SAB:   1  TOP:          1 ---------------------------------------------------------------------- Gestational Age  LMP:           14w 1d        Date:  05/10/18                 EDD:   02/14/19  Best:          14w 1d     Det. By:  LMP  (05/10/18)          EDD:   02/14/19 ---------------------------------------------------------------------- Anatomy  Choroid Plexus:  Appears normal         Cord Vessels:           Appears normal (3                                                                        vessel cord)  Stomach:               Appears normal, left   Kidneys:                Appear normal                         sided  Abdominal Wall:        Appears nml (cord      Bladder:                Appears  normal                         insert, abd wall) ---------------------------------------------------------------------- Cervix Uterus Adnexa  Cervix  Length:           2.53  cm.  Uterus  No abnormality visualized.  Left Ovary  Within normal limits.  Right Ovary  Not visualized.  Cul De Sac  No free fluid seen.  Adnexa  No abnormality visualized. ---------------------------------------------------------------------- Impression  Early intrauterine viable pregnancy  No evidence placental abruption. Recommendations  Follow up anatomy in 4 weeks. Electronically Signed by: Stacey Landsman, MD Final Report   08/17/2018 05:47 pm ----------------------------------------------------------------------   Assessment and Plan  Abdominal pain affecting pregnancy  - Information provided on abd pain in pg - Advised to take Tylenol 1000 mg every 6 hrs prn   Morning sickness - Rx for Reglan 10 mg daily - Information provided on HG   Gastroesophageal reflux disease without esophagitis  - Rx Pepcid 20 mg BID - Information provided on heartburn in pg, GERD in adults, and food choices for GERD   - Discharge patient - Keep scheduled appt with [facility] on 08/28/2018 - Patient verbalized an understanding of the plan of care and agrees.    Stacey Mora, MSN, CNM 08/17/2018, 3:20 PM

## 2018-08-17 NOTE — Progress Notes (Signed)
Pt states the burning she had in her stomach was resolved after the pepcid.  She is currently rating her pain a 3/10.

## 2018-08-17 NOTE — Discharge Instructions (Signed)

## 2018-08-18 LAB — GC/CHLAMYDIA PROBE AMP (~~LOC~~) NOT AT ARMC
CHLAMYDIA, DNA PROBE: NEGATIVE
Neisseria Gonorrhea: NEGATIVE

## 2018-08-28 ENCOUNTER — Other Ambulatory Visit: Payer: Self-pay

## 2018-08-28 ENCOUNTER — Ambulatory Visit (INDEPENDENT_AMBULATORY_CARE_PROVIDER_SITE_OTHER): Payer: Medicaid Other | Admitting: Family Medicine

## 2018-08-28 VITALS — BP 102/54 | HR 107 | Temp 97.3°F | Wt 144.8 lb

## 2018-08-28 DIAGNOSIS — O281 Abnormal biochemical finding on antenatal screening of mother: Secondary | ICD-10-CM | POA: Diagnosis not present

## 2018-08-28 DIAGNOSIS — Z3492 Encounter for supervision of normal pregnancy, unspecified, second trimester: Secondary | ICD-10-CM | POA: Insufficient documentation

## 2018-08-28 DIAGNOSIS — Z3A00 Weeks of gestation of pregnancy not specified: Secondary | ICD-10-CM | POA: Diagnosis not present

## 2018-08-28 NOTE — Progress Notes (Signed)
Stacey [name] is a 23 y.o. G3P0 at [redacted]w[redacted]d for routine follow up.  She reports nausea, no bleeding, no contractions, no cramping and no leaking See flow sheet for details.  Patient Was having pelvic pain a week ago.  She went to the MAU and was told to take tylenol.  She has been  Walking a lot since then, about  6000 steps a day.  Before she went to the MAU she couldn't walk because of the pain.  Hasn't taken the tylenol since then.  She  Is Taking reglan and pepcid for other conditions. Patient having  Insomnia.  Tries to sleep at ten, Falls asleep at 1230,  Gets up at 7.  Doesn't fall asleep during the day.  Doesn't work because of her 'high risk' pregnancy.    Not smoking cigarettees or marijuans (stopped at 8th week). No alcohol.   History of Ptsd, adhd, mood disorder when she was younger. She was on Vyvanse, lexapro, intuniv, abilify.  Has been very emotional due to baby's father.  She found out he was cheating on her and they no longer live together.  No concern for domestic violence.    Patient would like a quad test.    Patient takes One a day gummy.    Had vaginal bleeding in beginning of pregnancy.  Hasn't had any recently. No loss of fluid.    A/P: Pregnancy at [redacted]w[redacted]d.  Doing well.   Pregnancy issues include nausea, insomnia, dyspepsia.  Anatomy ultrasound ordered to be scheduled at 18-19 weeks. Pt  is interested in genetic screening. Quad test ordered.   Bleeding and pain precautions reviewed. Follow up 4 weeks.

## 2018-08-28 NOTE — Patient Instructions (Addendum)
It was great to see you today. We will schedule your ultrasound for you and then someone will call you and tell you when the appointment is.   I will get a blood test today to screen for genetic abnormalities like down syndrome.  I will call you with the results.    I would like you to come back in 4 weeks for your next appointment.

## 2018-08-29 ENCOUNTER — Telehealth: Payer: Self-pay | Admitting: Licensed Clinical Social Worker

## 2018-08-29 NOTE — Addendum Note (Signed)
Addended by: Georges Lynch T on: 08/29/2018 09:29 AM   Modules accepted: Orders

## 2018-08-29 NOTE — Addendum Note (Signed)
Addended by: Georges Lynch T on: 08/29/2018 09:31 AM   Modules accepted: Orders

## 2018-08-29 NOTE — Telephone Encounter (Signed)
  08/29/2018 Name: Stacey [name] MRN: [id number] DOB: 03/25/1996  LCSW received phone call from Ms. Stacey [name] reference community resources to support her pregnancy.     Intervention:Client interviewed and appropriate assessments performed. Provided client with information about Healthy Pregnancy Program and [facility] . Other interventions include Solution-Focused Strategies and emotional support.   Plan: Patient will contact resources provided and will call LCSW if needed.  Sammuel Hines, LCSW [facility]   [phone] 11:50 AM

## 2018-09-17 ENCOUNTER — Ambulatory Visit (HOSPITAL_COMMUNITY): Payer: Medicaid Other

## 2018-09-18 ENCOUNTER — Ambulatory Visit (HOSPITAL_COMMUNITY): Payer: Medicaid Other

## 2018-09-18 ENCOUNTER — Other Ambulatory Visit (HOSPITAL_COMMUNITY): Payer: Self-pay | Admitting: *Deleted

## 2018-09-18 ENCOUNTER — Ambulatory Visit (HOSPITAL_COMMUNITY)
Admission: RE | Admit: 2018-09-18 | Discharge: 2018-09-18 | Disposition: A | Discharge status: Home / Self Care | Payer: Medicaid Other | Source: Ambulatory Visit | Attending: Obstetrics and Gynecology | Admitting: Obstetrics and Gynecology

## 2018-09-18 ENCOUNTER — Other Ambulatory Visit: Payer: Self-pay | Admitting: Family Medicine

## 2018-09-18 ENCOUNTER — Other Ambulatory Visit: Payer: Self-pay

## 2018-09-18 DIAGNOSIS — Z363 Encounter for antenatal screening for malformations: Secondary | ICD-10-CM | POA: Diagnosis not present

## 2018-09-18 DIAGNOSIS — Z3A18 18 weeks gestation of pregnancy: Secondary | ICD-10-CM | POA: Diagnosis not present

## 2018-09-18 DIAGNOSIS — Z362 Encounter for other antenatal screening follow-up: Secondary | ICD-10-CM

## 2018-09-18 DIAGNOSIS — Z3492 Encounter for supervision of normal pregnancy, unspecified, second trimester: Secondary | ICD-10-CM | POA: Insufficient documentation

## 2018-09-24 ENCOUNTER — Other Ambulatory Visit (HOSPITAL_COMMUNITY): Payer: Medicaid Other

## 2018-10-02 ENCOUNTER — Ambulatory Visit: Payer: Medicaid Other | Admitting: Family Medicine

## 2018-10-07 ENCOUNTER — Other Ambulatory Visit: Payer: Self-pay

## 2018-10-07 ENCOUNTER — Ambulatory Visit (INDEPENDENT_AMBULATORY_CARE_PROVIDER_SITE_OTHER): Payer: Medicaid Other | Admitting: Family Medicine

## 2018-10-07 VITALS — BP 102/72 | HR 107 | Temp 98.7°F | Wt 148.8 lb

## 2018-10-07 DIAGNOSIS — R300 Dysuria: Secondary | ICD-10-CM

## 2018-10-07 DIAGNOSIS — Z3492 Encounter for supervision of normal pregnancy, unspecified, second trimester: Secondary | ICD-10-CM

## 2018-10-07 LAB — POCT URINALYSIS DIP (MANUAL ENTRY)
Bilirubin, UA: NEGATIVE
Blood, UA: NEGATIVE
Glucose, UA: NEGATIVE mg/dL
Ketones, POC UA: NEGATIVE mg/dL
Leukocytes, UA: NEGATIVE
Nitrite, UA: NEGATIVE
Protein Ur, POC: NEGATIVE mg/dL
Spec Grav, UA: 1.015 (ref 1.010–1.025)
Urobilinogen, UA: 0.2 E.U./dL
pH, UA: 6.5 (ref 5.0–8.0)

## 2018-10-07 MED ORDER — PNV PRENATAL PLUS MULTIVITAMIN 27-1 MG PO TABS: 1.0000 | ORAL_TABLET | Freq: Every day | ORAL | 0 refills | Status: DC

## 2018-10-07 NOTE — Progress Notes (Signed)
Subjective:    Stacey [name] is a 23 y.o. [id number] [redacted]w[redacted]d being seen today for her obstetrical visit.  That everything is going well, aside form some discomfort prior to urination. She thinks that she might be holding it in too long. UA performed which was negative.  She had a quad screen which was negative for any increased risk. Had anatomy ultrasound which was negative for abnormality. 4 chamber view and aortic arch were suboptimally seen 2/2 infant position. Rescan scheduled for 5/11.  Patient was seen in MAU for GERD. Started on pepcid. Has not been taking it as she changed her diet and has not been having symptoms. Has been taking prenatal vitamin, needs refill at this appointment.  Objective:    BP 102/72   Pulse (!) 107   Temp 98.7 F (37.1 C)   Wt 148 lb 12.8 oz (67.5 kg)   LMP 05/10/2018   BMI 23.31 kg/m   Physical Exam  Constitutional: She is oriented to person, place, and time. She appears well-developed and well-nourished.  HENT:  Head: Normocephalic.  Eyes: Pupils are equal, round, and reactive to light. Right eye exhibits no discharge. Left eye exhibits no discharge.  Cardiovascular: Normal rate and regular rhythm.  Respiratory: Effort normal.  GI: Soft. She exhibits no distension. There is no abdominal tenderness.  Musculoskeletal: Normal range of motion.        General: No deformity or edema.  Neurological: She is alert and oriented to person, place, and time.  Skin: Skin is warm.    Exam  FHT:  135-145 BPM  Uterine Size:  20.5cm  Presentation:  vertex     Assessment:   Pregnancy:  G3P0020 with no acute complaints. Ua performed which was negative for abnormality. Has repeat anatomy scan scheduled. Quad screen reviewed which was negative. Follow up in 4-6 weeks. Reviewed previous notes and not felt to need glucola from primary [facility] provider.    Plan:    Patient Active Problem List   Diagnosis Date Noted  . Prenatal care in second trimester 08/28/2018   . Abdominal pain affecting pregnancy 08/17/2018  . Supervision of low-risk pregnancy, first trimester 07/02/2018  . Seasonal allergic rhinitis 03/19/2018  . Vaginal trichomoniasis 07/19/2017  . Mood disorder (HCC) 06/24/2012   Follow up in 4-6 weeks.   repeat anatomy scan as above  Myrene Buddy MD PGY-2 Family Medicine Resident

## 2018-10-07 NOTE — Patient Instructions (Addendum)
It was great meeting you today! I am glad that things have been going well. Your genetic screen looked good. Please keep your anatomy scan appointment on 5/11. I refilled your multivitamin. Please let us know if you would like more pepcid for the reflux. Please come back in 4-6 weeks.  Second Trimester of Pregnancy The second trimester is from week 14 through week 27 (months 4 through 6). The second trimester is often a time when you feel your best. Your body has adjusted to being pregnant, and you begin to feel better physically. Usually, morning sickness has lessened or quit completely, you may have more energy, and you may have an increase in appetite. The second trimester is also a time when the fetus is growing rapidly. At the end of the sixth month, the fetus is about 9 inches long and weighs about 1 pounds. You will likely begin to feel the baby move (quickening) between 16 and 20 weeks of pregnancy. Body changes during your second trimester Your body continues to go through many changes during your second trimester. The changes vary from woman to woman.  Your weight will continue to increase. You will notice your lower abdomen bulging out.  You may begin to get stretch marks on your hips, abdomen, and breasts.  You may develop headaches that can be relieved by medicines. The medicines should be approved by your health care provider.  You may urinate more often because the fetus is pressing on your bladder.  You may develop or continue to have heartburn as a result of your pregnancy.  You may develop constipation because certain hormones are causing the muscles that push waste through your intestines to slow down.  You may develop hemorrhoids or swollen, bulging veins (varicose veins).  You may have back pain. This is caused by: ? Weight gain. ? Pregnancy hormones that are relaxing the joints in your pelvis. ? A shift in weight and the muscles that support your balance.  Your  breasts will continue to grow and they will continue to become tender.  Your gums may bleed and may be sensitive to brushing and flossing.  Dark spots or blotches (chloasma, mask of pregnancy) may develop on your face. This will likely fade after the baby is born.  A dark line from your belly button to the pubic area (linea nigra) may appear. This will likely fade after the baby is born.  You may have changes in your hair. These can include thickening of your hair, rapid growth, and changes in texture. Some women also have hair loss during or after pregnancy, or hair that feels dry or thin. Your hair will most likely return to normal after your baby is born. What to expect at prenatal visits During a routine prenatal visit:  You will be weighed to make sure you and the fetus are growing normally.  Your blood pressure will be taken.  Your abdomen will be measured to track your baby's growth.  The fetal heartbeat will be listened to.  Any test results from the previous visit will be discussed. Your health care provider may ask you:  How you are feeling.  If you are feeling the baby move.  If you have had any abnormal symptoms, such as leaking fluid, bleeding, severe headaches, or abdominal cramping.  If you are using any tobacco products, including cigarettes, chewing tobacco, and electronic cigarettes.  If you have any questions. Other tests that may be performed during your second trimester include:  Blood  tests that check for: ? Low iron levels (anemia). ? High blood sugar that affects pregnant women (gestational diabetes) between 4324 and 28 weeks. ? Rh antibodies. This is to check for a protein on red blood cells (Rh factor).  Urine tests to check for infections, diabetes, or protein in the urine.  An ultrasound to confirm the proper growth and development of the baby.  An amniocentesis to check for possible genetic problems.  Fetal screens for spina bifida and Down  syndrome.  HIV (human immunodeficiency virus) testing. Routine prenatal testing includes screening for HIV, unless you choose not to have this test. Follow these instructions at home: Medicines  Follow your health care provider's instructions regarding medicine use. Specific medicines may be either safe or unsafe to take during pregnancy.  Take a prenatal vitamin that contains at least 600 micrograms (mcg) of folic acid.  If you develop constipation, try taking a stool softener if your health care provider approves. Eating and drinking   Eat a balanced diet that includes fresh fruits and vegetables, whole grains, good sources of protein such as meat, eggs, or tofu, and low-fat dairy. Your health care provider will help you determine the amount of weight gain that is right for you.  Avoid raw meat and uncooked cheese. These carry germs that can cause birth defects in the baby.  If you have low calcium intake from food, talk to your health care provider about whether you should take a daily calcium supplement.  Limit foods that are high in fat and processed sugars, such as fried and sweet foods.  To prevent constipation: ? Drink enough fluid to keep your urine clear or pale yellow. ? Eat foods that are high in fiber, such as fresh fruits and vegetables, whole grains, and beans. Activity  Exercise only as directed by your health care provider. Most women can continue their usual exercise routine during pregnancy. Try to exercise for 30 minutes at least 5 days a week. Stop exercising if you experience uterine contractions.  Avoid heavy lifting, wear low heel shoes, and practice good posture.  A sexual relationship may be continued unless your health care provider directs you otherwise. Relieving pain and discomfort  Wear a good support bra to prevent discomfort from breast tenderness.  Take warm sitz baths to soothe any pain or discomfort caused by hemorrhoids. Use hemorrhoid cream if  your health care provider approves.  Rest with your legs elevated if you have leg cramps or low back pain.  If you develop varicose veins, wear support hose. Elevate your feet for 15 minutes, 3-4 times a day. Limit salt in your diet. Prenatal Care  Write down your questions. Take them to your prenatal visits.  Keep all your prenatal visits as told by your health care provider. This is important. Safety  Wear your seat belt at all times when driving.  Make a list of emergency phone numbers, including numbers for family, friends, the hospital, and police and fire departments. General instructions  Ask your health care provider for a referral to a local prenatal education class. Begin classes no later than the beginning of month 6 of your pregnancy.  Ask for help if you have counseling or nutritional needs during pregnancy. Your health care provider can offer advice or refer you to specialists for help with various needs.  Do not use hot tubs, steam rooms, or saunas.  Do not douche or use tampons or scented sanitary pads.  Do not cross your legs for  long periods of time.  Avoid cat litter boxes and soil used by cats. These carry germs that can cause birth defects in the baby and possibly loss of the fetus by miscarriage or stillbirth.  Avoid all smoking, herbs, alcohol, and unprescribed drugs. Chemicals in these products can affect the formation and growth of the baby.  Do not use any products that contain nicotine or tobacco, such as cigarettes and e-cigarettes. If you need help quitting, ask your health care provider.  Visit your dentist if you have not gone yet during your pregnancy. Use a soft toothbrush to brush your teeth and be gentle when you floss. Contact a health care provider if:  You have dizziness.  You have mild pelvic cramps, pelvic pressure, or nagging pain in the abdominal area.  You have persistent nausea, vomiting, or diarrhea.  You have a bad smelling  vaginal discharge.  You have pain when you urinate. Get help right away if:  You have a fever.  You are leaking fluid from your vagina.  You have spotting or bleeding from your vagina.  You have severe abdominal cramping or pain.  You have rapid weight gain or weight loss.  You have shortness of breath with chest pain.  You notice sudden or extreme swelling of your face, hands, ankles, feet, or legs.  You have not felt your baby move in over an hour.  You have severe headaches that do not go away when you take medicine.  You have vision changes. Summary  The second trimester is from week 14 through week 27 (months 4 through 6). It is also a time when the fetus is growing rapidly.  Your body goes through many changes during pregnancy. The changes vary from woman to woman.  Avoid all smoking, herbs, alcohol, and unprescribed drugs. These chemicals affect the formation and growth your baby.  Do not use any tobacco products, such as cigarettes, chewing tobacco, and e-cigarettes. If you need help quitting, ask your health care provider.  Contact your health care provider if you have any questions. Keep all prenatal visits as told by your health care provider. This is important. This information is not intended to replace advice given to you by your health care provider. Make sure you discuss any questions you have with your health care provider. Document Released: 05/29/2001 Document Revised: 07/10/2016 Document Reviewed: 07/10/2016 Elsevier Interactive Patient Education  2019 [employer].

## 2018-10-27 ENCOUNTER — Encounter (HOSPITAL_COMMUNITY): Payer: Self-pay

## 2018-10-27 ENCOUNTER — Other Ambulatory Visit: Payer: Self-pay

## 2018-10-27 ENCOUNTER — Ambulatory Visit (HOSPITAL_COMMUNITY): Payer: Medicaid Other | Admitting: *Deleted

## 2018-10-27 ENCOUNTER — Ambulatory Visit (HOSPITAL_COMMUNITY)
Admission: RE | Admit: 2018-10-27 | Discharge: 2018-10-27 | Disposition: A | Discharge status: Home / Self Care | Payer: Medicaid Other | Source: Ambulatory Visit | Attending: Obstetrics and Gynecology | Admitting: Obstetrics and Gynecology

## 2018-10-27 VITALS — Temp 98.7°F

## 2018-10-27 DIAGNOSIS — Z3A24 24 weeks gestation of pregnancy: Secondary | ICD-10-CM

## 2018-10-27 DIAGNOSIS — O0990 Supervision of high risk pregnancy, unspecified, unspecified trimester: Secondary | ICD-10-CM | POA: Insufficient documentation

## 2018-10-27 DIAGNOSIS — Z362 Encounter for other antenatal screening follow-up: Secondary | ICD-10-CM | POA: Insufficient documentation

## 2018-11-06 ENCOUNTER — Ambulatory Visit (INDEPENDENT_AMBULATORY_CARE_PROVIDER_SITE_OTHER): Payer: Medicaid Other | Admitting: Family Medicine

## 2018-11-06 ENCOUNTER — Other Ambulatory Visit: Payer: Self-pay

## 2018-11-06 ENCOUNTER — Other Ambulatory Visit (HOSPITAL_COMMUNITY)
Admission: RE | Admit: 2018-11-06 | Discharge: 2018-11-06 | Disposition: A | Discharge status: Home / Self Care | Payer: Medicaid Other | Source: Ambulatory Visit | Attending: Family Medicine | Admitting: Family Medicine

## 2018-11-06 VITALS — BP 122/60 | HR 105 | Wt 154.0 lb

## 2018-11-06 DIAGNOSIS — O210 Mild hyperemesis gravidarum: Secondary | ICD-10-CM

## 2018-11-06 DIAGNOSIS — O4690 Antepartum hemorrhage, unspecified, unspecified trimester: Secondary | ICD-10-CM

## 2018-11-06 LAB — POCT URINALYSIS DIP (MANUAL ENTRY)
Bilirubin, UA: NEGATIVE
Blood, UA: NEGATIVE
Glucose, UA: NEGATIVE mg/dL
Ketones, POC UA: NEGATIVE mg/dL
Leukocytes, UA: NEGATIVE
Nitrite, UA: NEGATIVE
Protein Ur, POC: NEGATIVE mg/dL
Spec Grav, UA: 1.015 (ref 1.010–1.025)
Urobilinogen, UA: 0.2 E.U./dL
pH, UA: 8 (ref 5.0–8.0)

## 2018-11-06 MED ORDER — DOXYLAMINE-PYRIDOXINE 10-10 MG PO TBEC: 1.0000 | DELAYED_RELEASE_TABLET | Freq: Every day | ORAL | 0 refills | Status: DC | PRN

## 2018-11-06 NOTE — Progress Notes (Signed)
Stacey [name] is a 23 y.o. G3P0020 at [redacted]w[redacted]d here for routine follow up.  She reports nausea, vomiting, vaginal bleeding, abdominal pain with contractions. Patient has had multiple episodes. Painless vaginal bleeding. Happened 3 times. Larged size clot $0.50 piece. No gush of fluid. Baby is moving around normally.  HR 151-154. Measuring 23 inches. Last U/S on 10/28/18 showed posterior placenta. Does not happen immediately after intercouse. Able   Physical Exam  Constitutional: She is well-developed, well-nourished, and in no distress. No distress.  Cardiovascular: Normal rate and regular rhythm.  Pulmonary/Chest: Effort normal and breath sounds normal.  Abdominal: Soft. She exhibits no distension. There is no abdominal tenderness.  Genitourinary:    Cervix and uterus normal.     No vaginal discharge.     Genitourinary Comments: Mucoid cervic, closed Os   Neurological: She is alert.  Skin: Skin is warm and dry.  Psychiatric: Affect and judgment normal.      A/P: Pregnancy at [redacted]w[redacted]d.  Pregnancy issues include painless vaginal bleeding. Nausea and vomiting.  Reassuring exam w/ closed Os.   Causes of vaginal bleeding include placental (although most recent ultrasound showed posterior, normal placenta), subchorionic hemorrhage (again not seen on recent ultrasound), could be mistaken vaginal bleeding and urinary bleeding instead. - CBC - UA, UCx - GC/Chlamydia, Wet prep - Ultrasound - close follow early next week  - b12 for nausea Preterm labor and fetal movement precautions reviewed.  Follow up < 1 week.

## 2018-11-06 NOTE — Patient Instructions (Signed)
It was a pleasure to see you today! Thank you for choosing [facility] for your primary care. Stacey [name] was seen for vaginal bleeding.   Go to the emergency room if you have worsening or recurrence of abdominal pain or vaginal bleeding.   Best,  Thomes Dinning, MD, MS FAMILY MEDICINE RESIDENT - PGY1 11/06/2018 3:06 PM

## 2018-11-07 ENCOUNTER — Other Ambulatory Visit: Payer: Self-pay | Admitting: Family Medicine

## 2018-11-07 DIAGNOSIS — B373 Candidiasis of vulva and vagina: Secondary | ICD-10-CM

## 2018-11-07 DIAGNOSIS — B3731 Acute candidiasis of vulva and vagina: Secondary | ICD-10-CM

## 2018-11-07 LAB — CBC
Hematocrit: 33 % — ABNORMAL LOW (ref 34.0–46.6)
Hemoglobin: 11.3 g/dL (ref 11.1–15.9)
MCH: 30.9 pg (ref 26.6–33.0)
MCHC: 34.2 g/dL (ref 31.5–35.7)
MCV: 90 fL (ref 79–97)
Platelets: 310 10*3/uL (ref 150–450)
RBC: 3.66 x10E6/uL — ABNORMAL LOW (ref 3.77–5.28)
RDW: 12.2 % (ref 11.7–15.4)
WBC: 12.1 10*3/uL — ABNORMAL HIGH (ref 3.4–10.8)

## 2018-11-07 LAB — CERVICOVAGINAL ANCILLARY ONLY
Bacterial vaginitis: NEGATIVE
Candida vaginitis: POSITIVE — AB
Chlamydia: NEGATIVE
Neisseria Gonorrhea: NEGATIVE
Trichomonas: NEGATIVE

## 2018-11-07 MED ORDER — CLOTRIMAZOLE 2 % VA CREA: 1.0000 | TOPICAL_CREAM | Freq: Every day | VAGINAL | 0 refills | Status: AC

## 2018-11-07 NOTE — Progress Notes (Signed)
Will not treat with diflucan as this is contraindicated in pregnancy. Will instead use vaginal clotrimazole.

## 2018-11-08 LAB — URINE CULTURE, OB REFLEX

## 2018-11-08 LAB — CULTURE, OB URINE

## 2018-11-09 ENCOUNTER — Encounter: Payer: Self-pay | Admitting: Family Medicine

## 2018-11-11 ENCOUNTER — Ambulatory Visit (INDEPENDENT_AMBULATORY_CARE_PROVIDER_SITE_OTHER): Payer: Medicaid Other | Admitting: Family Medicine

## 2018-11-11 ENCOUNTER — Other Ambulatory Visit: Payer: Self-pay

## 2018-11-11 VITALS — BP 104/60 | Wt 154.1 lb

## 2018-11-11 DIAGNOSIS — O4692 Antepartum hemorrhage, unspecified, second trimester: Secondary | ICD-10-CM

## 2018-11-11 DIAGNOSIS — K5909 Other constipation: Secondary | ICD-10-CM

## 2018-11-11 DIAGNOSIS — Z3492 Encounter for supervision of normal pregnancy, unspecified, second trimester: Secondary | ICD-10-CM

## 2018-11-11 HISTORY — DX: Antepartum hemorrhage, unspecified, second trimester: O46.92

## 2018-11-11 MED ORDER — POLYETHYLENE GLYCOL 3350 17 GM/SCOOP PO POWD: 17.0000 g | Freq: Every day | ORAL | 1 refills | Status: DC

## 2018-11-11 NOTE — Assessment & Plan Note (Signed)
  rx for miralax given to use 1-2 times daily with goal of daily soft BM

## 2018-11-11 NOTE — Patient Instructions (Signed)
   Please use the yeast medicine as prescribed to treat yeast infection Please take miralax daily to help have a soft bowel movement EVERY DAY  We'll get the ultrasound Friday at 11 am and let you know the results  If you have any more bleeding between now and then please go to [facility]    If you have questions or concerns please do not hesitate to call at [phone].  Dolores Patty, DO PGY-3, [facility] 11/11/2018 10:14 AM

## 2018-11-11 NOTE — Assessment & Plan Note (Signed)
  Has not had any bleeding for over a week. Advised patient to treat yeast infection. She states she will pick up prescription and begin today.  Advised her to get US done Friday morning and we will follow up on results.  Advised to go to [facility] if bleeding recurs or she has abdominal pain, cramping, LOF

## 2018-11-11 NOTE — Progress Notes (Signed)
    Subjective:    Patient ID: Stacey [name], female    DOB: [birth year], 23 y.o.   MRN: [id number]   CC: follow up vaginal bleeding  HPI: Patient is a 23 year old w/ PMH of mood disorder, suicide attempt who is at [redacted]w[redacted]d [place]. She was seen for routine prenatal visit last week and mentioned vaginal bleeding to the provider. She had a negative UA. Wet prep showed yeast and she was prescribed gyne-lotrimin cream which she did not get or use yet. She was scheduled for [place]. This will be done 11/14/18.   Today she is coming in for follow up. She reports she is doing well and has not had any further bleeding. She denies any abdominal pain or pelvic pain. No cramping. She reports feeling baby move. No LOF.   She is asking about hemorrhoids and feels she has them. She reports she "sometimes goes 2 weeks without pooping" and when she does go she strains and has blood on the toilet paper with wiping.   Smoking status reviewed- non-smoker  Review of Systems- see HPI   Objective:  BP 104/60   Wt 154 lb 2 oz (69.9 kg)   LMP 05/10/2018   BMI 24.14 kg/m  Vitals and nursing note reviewed  General: well nourished, in no acute distress HEENT: normocephalic, MMM Cardiac: regular rate Respiratory: no increased work of breathing Abdomen: gravid, soft, nontender Neuro: alert and oriented, no focal deficits   Assessment & Plan:    Second trimester bleeding  Has not had any bleeding for over a week. Advised patient to treat yeast infection. She states she will pick up prescription and begin today.  Advised her to get US done Friday morning and we will follow up on results.  Advised to go to [facility] if bleeding recurs or she has abdominal pain, cramping, LOF  Other constipation  rx for miralax given to use 1-2 times daily with goal of daily soft BM    Return in about 2 weeks (around 11/25/2018).   Dolores Patty, DO Family Medicine Resident PGY-3

## 2018-11-13 ENCOUNTER — Encounter: Payer: Medicaid Other | Admitting: Family Medicine

## 2018-11-14 ENCOUNTER — Ambulatory Visit (HOSPITAL_COMMUNITY)
Admission: RE | Admit: 2018-11-14 | Payer: Medicaid Other | Source: Ambulatory Visit | Attending: Family Medicine | Admitting: Family Medicine

## 2018-11-25 ENCOUNTER — Ambulatory Visit (INDEPENDENT_AMBULATORY_CARE_PROVIDER_SITE_OTHER): Payer: Medicaid Other | Admitting: Family Medicine

## 2018-11-25 ENCOUNTER — Other Ambulatory Visit: Payer: Self-pay

## 2018-11-25 VITALS — BP 98/54 | HR 103 | Temp 99.1°F | Wt 155.6 lb

## 2018-11-25 DIAGNOSIS — Z3492 Encounter for supervision of normal pregnancy, unspecified, second trimester: Secondary | ICD-10-CM | POA: Diagnosis not present

## 2018-11-25 DIAGNOSIS — Z23 Encounter for immunization: Secondary | ICD-10-CM

## 2018-11-25 LAB — POCT 1 HR PRENATAL GLUCOSE: Glucose 1 Hr Prenatal, POC: 110 mg/dL

## 2018-11-25 NOTE — Progress Notes (Signed)
Stacey [name] is a 23 y.o. G3P0020 at [redacted]w[redacted]d here for routine follow up.  She reports no leakage of fluid, vaginal bleeding, contractions.  Baby is moving normally. See flow sheet for details.  A/P: Pregnancy at [redacted]w[redacted]d.  Doing well.   Pregnancy issues include none  Infant feeding choice: Breast and bottle Contraception choice: Unsure  Tdap was given today. 1 hour glucola, CBC, RPR, and HIV were done today.   Pregnancy medical home and PHQ-9 forms were done today and reviewed.   Rh status was reviewed and patient does not need Rhogam.  Rhogam was not given today.   Childbirth and education classes were not offered. Preterm labor and fetal movement precautions reviewed. Follow up 2 weeks.

## 2018-11-25 NOTE — Patient Instructions (Signed)

## 2018-11-26 ENCOUNTER — Encounter: Payer: Self-pay | Admitting: Family Medicine

## 2018-11-26 DIAGNOSIS — O99013 Anemia complicating pregnancy, third trimester: Secondary | ICD-10-CM

## 2018-11-26 LAB — CBC
Hematocrit: 32.9 % — ABNORMAL LOW (ref 34.0–46.6)
Hemoglobin: 10.9 g/dL — ABNORMAL LOW (ref 11.1–15.9)
MCH: 30.7 pg (ref 26.6–33.0)
MCHC: 33.1 g/dL (ref 31.5–35.7)
MCV: 93 fL (ref 79–97)
Platelets: 252 10*3/uL (ref 150–450)
RBC: 3.55 x10E6/uL — ABNORMAL LOW (ref 3.77–5.28)
RDW: 12.3 % (ref 11.7–15.4)
WBC: 11.6 10*3/uL — ABNORMAL HIGH (ref 3.4–10.8)

## 2018-11-26 LAB — HIV ANTIBODY (ROUTINE TESTING W REFLEX): HIV Screen 4th Generation wRfx: NONREACTIVE

## 2018-11-26 LAB — RPR: RPR Ser Ql: NONREACTIVE

## 2018-11-26 MED ORDER — PRENATAL/IRON PO TABS: 1.0000 | ORAL_TABLET | Freq: Every morning | ORAL | 0 refills | Status: DC

## 2018-12-12 ENCOUNTER — Ambulatory Visit: Payer: Medicaid Other | Admitting: Family Medicine

## 2018-12-12 ENCOUNTER — Other Ambulatory Visit: Payer: Self-pay

## 2018-12-12 ENCOUNTER — Ambulatory Visit (INDEPENDENT_AMBULATORY_CARE_PROVIDER_SITE_OTHER): Payer: Medicaid Other | Admitting: Family Medicine

## 2018-12-12 DIAGNOSIS — Z3491 Encounter for supervision of normal pregnancy, unspecified, first trimester: Secondary | ICD-10-CM

## 2018-12-12 NOTE — Patient Instructions (Signed)
Good to see you today We'll see you back on 7/16 at 11:30 am for your next OB appointment.  If you have questions or concerns please do not hesitate to call at [phone]. Stacey PattyAngela Jaryd Drew, DO PGY-3, [facility] 12/12/2018 10:25 AM   Third Trimester of Pregnancy The third trimester is from week 28 through week 40 (months 7 through 9). The third trimester is a time when the unborn baby (fetus) is growing rapidly. At the end of the ninth month, the fetus is about 20 inches in length and weighs 6-10 pounds. Body changes during your third trimester Your body will continue to go through many changes during pregnancy. The changes vary from woman to woman. During the third trimester:  Your weight will continue to increase. You can expect to gain 25-35 pounds (11-16 kg) by the end of the pregnancy.  You may begin to get stretch marks on your hips, abdomen, and breasts.  You may urinate more often because the fetus is moving lower into your pelvis and pressing on your bladder.  You may develop or continue to have heartburn. This is caused by increased hormones that slow down muscles in the digestive tract.  You may develop or continue to have constipation because increased hormones slow digestion and cause the muscles that push waste through your intestines to relax.  You may develop hemorrhoids. These are swollen veins (varicose veins) in the rectum that can itch or be painful.  You may develop swollen, bulging veins (varicose veins) in your legs.  You may have increased body aches in the pelvis, back, or thighs. This is due to weight gain and increased hormones that are relaxing your joints.  You may have changes in your hair. These can include thickening of your hair, rapid growth, and changes in texture. Some women also have hair loss during or after pregnancy, or hair that feels dry or thin. Your hair will most likely return to normal after your baby is born.  Your breasts  will continue to grow and they will continue to become tender. A yellow fluid (colostrum) may leak from your breasts. This is the first milk you are producing for your baby.  Your belly button may stick out.  You may notice more swelling in your hands, face, or ankles.  You may have increased tingling or numbness in your hands, arms, and legs. The skin on your belly may also feel numb.  You may feel short of breath because of your expanding uterus.  You may have more problems sleeping. This can be caused by the size of your belly, increased need to urinate, and an increase in your body's metabolism.  You may notice the fetus "dropping," or moving lower in your abdomen (lightening).  You may have increased vaginal discharge.  You may notice your joints feel loose and you may have pain around your pelvic bone. What to expect at prenatal visits You will have prenatal exams every 2 weeks until week 36. Then you will have weekly prenatal exams. During a routine prenatal visit:  You will be weighed to make sure you and the baby are growing normally.  Your blood pressure will be taken.  Your abdomen will be measured to track your baby's growth.  The fetal heartbeat will be listened to.  Any test results from the previous visit will be discussed.  You may have a cervical check near your due date to see if your cervix has softened or thinned (effaced).  You  will be tested for Group B streptococcus. This happens between 35 and 37 weeks. Your health care provider may ask you:  What your birth plan is.  How you are feeling.  If you are feeling the baby move.  If you have had any abnormal symptoms, such as leaking fluid, bleeding, severe headaches, or abdominal cramping.  If you are using any tobacco products, including cigarettes, chewing tobacco, and electronic cigarettes.  If you have any questions. Other tests or screenings that may be performed during your third trimester  include:  Blood tests that check for low iron levels (anemia).  Fetal testing to check the health, activity level, and growth of the fetus. Testing is done if you have certain medical conditions or if there are problems during the pregnancy.  Nonstress test (NST). This test checks the health of your baby to make sure there are no signs of problems, such as the baby not getting enough oxygen. During this test, a belt is placed around your belly. The baby is made to move, and its heart rate is monitored during movement. What is false labor? False labor is a condition in which you feel small, irregular tightenings of the muscles in the womb (contractions) that usually go away with rest, changing position, or drinking water. These are called Braxton Hicks contractions. Contractions may last for hours, days, or even weeks before true labor sets in. If contractions come at regular intervals, become more frequent, increase in intensity, or become painful, you should see your health care provider. What are the signs of labor?  Abdominal cramps.  Regular contractions that start at 10 minutes apart and become stronger and more frequent with time.  Contractions that start on the top of the uterus and spread down to the lower abdomen and back.  Increased pelvic pressure and dull back pain.  A watery or bloody mucus discharge that comes from the vagina.  Leaking of amniotic fluid. This is also known as your "water breaking." It could be a slow trickle or a gush. Let your health care provider know if it has a color or strange odor. If you have any of these signs, call your health care provider right away, even if it is before your due date. Follow these instructions at home: Medicines  Follow your health care provider's instructions regarding medicine use. Specific medicines may be either safe or unsafe to take during pregnancy.  Take a prenatal vitamin that contains at least 600 micrograms (mcg) of  folic acid.  If you develop constipation, try taking a stool softener if your health care provider approves. Eating and drinking   Eat a balanced diet that includes fresh fruits and vegetables, whole grains, good sources of protein such as meat, eggs, or tofu, and low-fat dairy. Your health care provider will help you determine the amount of weight gain that is right for you.  Avoid raw meat and uncooked cheese. These carry germs that can cause birth defects in the baby.  If you have low calcium intake from food, talk to your health care provider about whether you should take a daily calcium supplement.  Eat four or five small meals rather than three large meals a day.  Limit foods that are high in fat and processed sugars, such as fried and sweet foods.  To prevent constipation: ? Drink enough fluid to keep your urine clear or pale yellow. ? Eat foods that are high in fiber, such as fresh fruits and vegetables, whole grains, and  beans. Activity  Exercise only as directed by your health care provider. Most women can continue their usual exercise routine during pregnancy. Try to exercise for 30 minutes at least 5 days a week. Stop exercising if you experience uterine contractions.  Avoid heavy lifting.  Do not exercise in extreme heat or humidity, or at high altitudes.  Wear low-heel, comfortable shoes.  Practice good posture.  You may continue to have sex unless your health care provider tells you otherwise. Relieving pain and discomfort  Take frequent breaks and rest with your legs elevated if you have leg cramps or low back pain.  Take warm sitz baths to soothe any pain or discomfort caused by hemorrhoids. Use hemorrhoid cream if your health care provider approves.  Wear a good support bra to prevent discomfort from breast tenderness.  If you develop varicose veins: ? Wear support pantyhose or compression stockings as told by your healthcare provider. ? Elevate your feet  for 15 minutes, 3-4 times a day. Prenatal care  Write down your questions. Take them to your prenatal visits.  Keep all your prenatal visits as told by your health care provider. This is important. Safety  Wear your seat belt at all times when driving.  Make a list of emergency phone numbers, including numbers for family, friends, the hospital, and police and fire departments. General instructions  Avoid cat litter boxes and soil used by cats. These carry germs that can cause birth defects in the baby. If you have a cat, ask someone to clean the litter box for you.  Do not travel far distances unless it is absolutely necessary and only with the approval of your health care provider.  Do not use hot tubs, steam rooms, or saunas.  Do not drink alcohol.  Do not use any products that contain nicotine or tobacco, such as cigarettes and e-cigarettes. If you need help quitting, ask your health care provider.  Do not use any medicinal herbs or unprescribed drugs. These chemicals affect the formation and growth of the baby.  Do not douche or use tampons or scented sanitary pads.  Do not cross your legs for long periods of time.  To prepare for the arrival of your baby: ? Take prenatal classes to understand, practice, and ask questions about labor and delivery. ? Make a trial run to the hospital. ? Visit the hospital and tour the maternity area. ? Arrange for maternity or paternity leave through employers. ? Arrange for family and friends to take care of pets while you are in the hospital. ? Purchase a rear-facing car seat and make sure you know how to install it in your car. ? Pack your hospital bag. ? Prepare the baby's nursery. Make sure to remove all pillows and stuffed animals from the baby's crib to prevent suffocation.  Visit your dentist if you have not gone during your pregnancy. Use a soft toothbrush to brush your teeth and be gentle when you floss. Contact a health care  provider if:  You are unsure if you are in labor or if your water has broken.  You become dizzy.  You have mild pelvic cramps, pelvic pressure, or nagging pain in your abdominal area.  You have lower back pain.  You have persistent nausea, vomiting, or diarrhea.  You have an unusual or bad smelling vaginal discharge.  You have pain when you urinate. Get help right away if:  Your water breaks before 37 weeks.  You have regular contractions less than 5  minutes apart before 37 weeks.  You have a fever.  You are leaking fluid from your vagina.  You have spotting or bleeding from your vagina.  You have severe abdominal pain or cramping.  You have rapid weight loss or weight gain.  You have shortness of breath with chest pain.  You notice sudden or extreme swelling of your face, hands, ankles, feet, or legs.  Your baby makes fewer than 10 movements in 2 hours.  You have severe headaches that do not go away when you take medicine.  You have vision changes. Summary  The third trimester is from week 28 through week 40, months 7 through 9. The third trimester is a time when the unborn baby (fetus) is growing rapidly.  During the third trimester, your discomfort may increase as you and your baby continue to gain weight. You may have abdominal, leg, and back pain, sleeping problems, and an increased need to urinate.  During the third trimester your breasts will keep growing and they will continue to become tender. A yellow fluid (colostrum) may leak from your breasts. This is the first milk you are producing for your baby.  False labor is a condition in which you feel small, irregular tightenings of the muscles in the womb (contractions) that eventually go away. These are called Braxton Hicks contractions. Contractions may last for hours, days, or even weeks before true labor sets in.  Signs of labor can include: abdominal cramps; regular contractions that start at 10 minutes  apart and become stronger and more frequent with time; watery or bloody mucus discharge that comes from the vagina; increased pelvic pressure and dull back pain; and leaking of amniotic fluid. This information is not intended to replace advice given to you by your health care provider. Make sure you discuss any questions you have with your health care provider. Document Released: 05/29/2001 Document Revised: 09/25/2018 Document Reviewed: 07/10/2016 Elsevier Patient Education  2020 [employer].

## 2018-12-12 NOTE — Progress Notes (Signed)
  Stacey [name] is a 23 y.o. G3P0020 at [redacted]w[redacted]d here for routine follow up.  She reports +FM, no LOF, no bleeding, no contractions. See flow sheet for details.  A/P: Pregnancy at [redacted]w[redacted]d.  Doing well.   Pregnancy issues include mood disorder (ODD, ADHD, PTSD), history of suicide attempt, constipation, trichomonas infection in first trimester with negative TOC, vaginal bleeding in 2nd trimester.  Infant feeding choice: both Contraception choice: unsure Infant circumcision desired: yes wants to get this done at [facility]  Tdap was not given today. Given 11/25/18.  Preterm labor and fetal movement precautions reviewed. Safe sleep discussed. Follow up 2 weeks in OB clinic.  Lucila Maine, DO PGY-3, [facility] 12/12/2018 8:39 AM

## 2018-12-23 ENCOUNTER — Encounter (HOSPITAL_COMMUNITY): Payer: Self-pay | Admitting: *Deleted

## 2018-12-23 ENCOUNTER — Ambulatory Visit: Payer: Medicaid Other | Admitting: Family Medicine

## 2018-12-23 ENCOUNTER — Inpatient Hospital Stay (HOSPITAL_COMMUNITY)
Admission: AD | Admit: 2018-12-23 | Discharge: 2018-12-23 | Disposition: A | Discharge status: Home / Self Care | Payer: Medicaid Other | Attending: Obstetrics and Gynecology | Admitting: Obstetrics and Gynecology

## 2018-12-23 ENCOUNTER — Other Ambulatory Visit: Payer: Self-pay

## 2018-12-23 DIAGNOSIS — Z880 Allergy status to penicillin: Secondary | ICD-10-CM | POA: Insufficient documentation

## 2018-12-23 DIAGNOSIS — B373 Candidiasis of vulva and vagina: Secondary | ICD-10-CM | POA: Diagnosis not present

## 2018-12-23 DIAGNOSIS — Z3A32 32 weeks gestation of pregnancy: Secondary | ICD-10-CM

## 2018-12-23 DIAGNOSIS — O4693 Antepartum hemorrhage, unspecified, third trimester: Secondary | ICD-10-CM | POA: Diagnosis not present

## 2018-12-23 DIAGNOSIS — Z87891 Personal history of nicotine dependence: Secondary | ICD-10-CM | POA: Diagnosis not present

## 2018-12-23 DIAGNOSIS — O98813 Other maternal infectious and parasitic diseases complicating pregnancy, third trimester: Secondary | ICD-10-CM | POA: Diagnosis not present

## 2018-12-23 LAB — URINALYSIS, ROUTINE W REFLEX MICROSCOPIC
Bilirubin Urine: NEGATIVE
Glucose, UA: NEGATIVE mg/dL
Ketones, ur: NEGATIVE mg/dL
Nitrite: NEGATIVE
Protein, ur: NEGATIVE mg/dL
Specific Gravity, Urine: 1.008 (ref 1.005–1.030)
pH: 7 (ref 5.0–8.0)

## 2018-12-23 LAB — WET PREP, GENITAL
Clue Cells Wet Prep HPF POC: NONE SEEN
Sperm: NONE SEEN
Trich, Wet Prep: NONE SEEN
Yeast Wet Prep HPF POC: NONE SEEN

## 2018-12-23 NOTE — Progress Notes (Addendum)
Pt states she's hungry and doesn't want to stay for additional EFM.  States she's leaving.

## 2018-12-23 NOTE — MAU Note (Signed)
Presents with c/o LOF yesterday and spotting this morning.  Reports leaking intermittently & spotting has been with wiping.  Reports +FM.

## 2018-12-23 NOTE — Progress Notes (Signed)
Pt left AMA, states she's hungry and need to eat.  Pt informed fetus needed further EFM, pt states she knows "he's ok, because he's moving."

## 2018-12-23 NOTE — MAU Provider Note (Addendum)
History     CSN: [id number]  Arrival date and time: 12/23/18 1017   First Provider Initiated Contact with Patient 12/23/18 1103      Chief Complaint  Patient presents with  . Vaginal Bleeding  . Rupture of Membranes  . Back Pain   Stacey [name] is a 23 y.o. G3P0020 at 6830w3d who receives care [facility].  She presents today for Vaginal Bleeding, Rupture of Membranes, and Back Pain.  She states she had some loss of fluid yesterday that resolved, but is now having spotting with wiping.  Patient reports the spotting "is pretty red today."  She reports that she has been having spotting throughout the pregnancy and endorses fetal movement.  She denies recent sexual activity.       OB History    Gravida  3   Para      Term      Preterm      AB  2   Living        SAB  1   TAB  1   Ectopic      Multiple      Live Births              Past Medical History:  Diagnosis Date  . ADHD   . ADHD (attention deficit hyperactivity disorder)   . Depression   . Mood disorder (HCC)   . ODD (oppositional defiant disorder)   . PTSD (post-traumatic stress disorder)   . PTSD (post-traumatic stress disorder)   . 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 NO PAST SURGERIES      Family History  Problem Relation Age of Onset  . Depression Mother        bipolar, ptsd  . ADD / ADHD Mother   . Depression Father        depression,ptsd  . ADD / ADHD Father   . ADD / ADHD Sister   . ADD / ADHD Brother   . Diabetes Paternal Grandmother   . Diabetes Paternal Grandfather   . Hyperlipidemia Paternal Grandfather     Social History   Tobacco Use  . Smoking status: Former Games developermoker  . Smokeless tobacco: Never Used  Substance Use Topics  . Alcohol use: No    Alcohol/week: 0.0 standard drinks  . Drug use: Not Currently    Types: Marijuana    Comment: stopped @ 4months of pregnancy     Allergies:  Allergies  Allergen Reactions  . Penicillins Other (See Comments)    Has patient had a PCN reaction causing immediate rash, facial/tongue/throat swelling, SOB or lightheadedness with hypotension: No Has patient had a PCN reaction causing severe rash involving mucus membranes or skin necrosis: No Has patient had a PCN reaction that required hospitalization No Has pt had a PCN reaction in the last 10 years? No. Nearly universal familial allergy. Pt has never taken PCN.     Medications Prior to Admission  Medication Sig Dispense Refill Last Dose  . polyethylene glycol powder (GLYCOLAX/MIRALAX) 17 GM/SCOOP powder Take 17 g by mouth daily. 507 g 1 Past Month at Unknown time  . Doxylamine-Pyridoxine 10-10 MG TBEC Take 1 tablet by mouth daily as needed (nausea and vomiting). 60 tablet 0   . famotidine (PEPCID) 20 MG tablet Take 1 tablet (20 mg total) by mouth 2 (two) times daily. (Patient not taking: Reported on 10/27/2018) 60 tablet 0   .  metoCLOPramide (REGLAN) 10 MG tablet Take 1 tablet (10 mg total) by mouth every 6 (six) hours. (Patient not taking: Reported on 10/27/2018) 30 tablet 0   . Prenatal Multivit-Min-Fe-FA (PRENATAL/IRON) TABS Take 1 tablet by mouth every morning. 30 each 0 12/20/2018  . Prenatal Vit-Fe Fumarate-FA (PNV PRENATAL PLUS MULTIVITAMIN) 27-1 MG TABS Take 1 Dose by mouth daily. 90 tablet 0     Review of Systems  Constitutional: Negative for chills and fever.  Eyes: Negative for visual disturbance.  Respiratory: Negative for cough and shortness of breath.   Gastrointestinal: Positive for constipation (BM 2-3 days ago) and nausea. Negative for abdominal pain, diarrhea and vomiting.  Genitourinary: Positive for vaginal bleeding and vaginal discharge (White, No smell, itching, or burning.). Negative for difficulty urinating and dysuria.  Musculoskeletal: Positive for back pain (Constant since 30 weeks).  Neurological: Negative for dizziness, light-headedness and  headaches.   Physical Exam   Blood pressure 105/64, pulse (!) 103, temperature 98.7 F (37.1 C), temperature source Oral, resp. rate 20, height 5\' 7"  (1.702 m), weight 69.9 kg, last menstrual period 05/10/2018, SpO2 99 %.  Physical Exam  Constitutional: She is oriented to person, place, and time. She appears well-developed and well-nourished.  HENT:  Head: Normocephalic and atraumatic.  Eyes: Conjunctivae are normal.  Neck: Normal range of motion.  Cardiovascular: Normal rate.  Respiratory: Effort normal.  GI: Soft.  Genitourinary: Cervix exhibits discharge. Cervix exhibits no motion tenderness and no friability.    Vaginal discharge present.     No vaginal bleeding.  No bleeding in the vagina.    Genitourinary Comments: Speculum Exam: -Vaginal Vault: Mucosa appears inflamed, tender to touch.  Small amt thick white curdy -wet prep collected -Cervix:Dark red, no lesions, cysts, or polyps.  Appears closed. No active bleeding, but thick white discharge noted from os-GC/CT collected -Bimanual Exam: Closed   Musculoskeletal: Normal range of motion.  Neurological: She is alert and oriented to person, place, and time.  Skin: Skin is warm and dry.  Psychiatric: She has a normal mood and affect. Her behavior is normal.    Fetal Assessment 145 bpm, Mod Var, -Decels, -Accels Toco: Irritability  MAU Course   Results for orders placed or performed during the hospital encounter of 12/23/18 (from the past 24 hour(s))  Urinalysis, Routine w reflex microscopic     Status: Abnormal   Collection Time: 12/23/18 10:51 AM  Result Value Ref Range   Color, Urine YELLOW YELLOW   APPearance HAZY (A) CLEAR   Specific Gravity, Urine 1.008 1.005 - 1.030   pH 7.0 5.0 - 8.0   Glucose, UA NEGATIVE NEGATIVE mg/dL   Hgb urine dipstick LARGE (A) NEGATIVE   Bilirubin Urine NEGATIVE NEGATIVE   Ketones, ur NEGATIVE NEGATIVE mg/dL   Protein, ur NEGATIVE NEGATIVE mg/dL   Nitrite NEGATIVE NEGATIVE    Leukocytes,Ua LARGE (A) NEGATIVE   WBC, UA 0-5 0 - 5 WBC/hpf   Bacteria, UA RARE (A) NONE SEEN   Squamous Epithelial / LPF 11-20 0 - 5  Wet prep, genital     Status: Abnormal   Collection Time: 12/23/18 11:17 AM   Specimen: Thin Prep Cervical/Endocervical  Result Value Ref Range   Yeast Wet Prep HPF POC NONE SEEN NONE SEEN   Trich, Wet Prep NONE SEEN NONE SEEN   Clue Cells Wet Prep HPF POC NONE SEEN NONE SEEN   WBC, Wet Prep HPF POC MANY (A) NONE SEEN   Sperm NONE SEEN    No results found.  MDM PE Labs: Wet Prep and GC/CT EFM  Assessment and Plan  23 year old G3P0020  SIUP at 32.3 weeks Cat I FT Candidiasis of Vagina  -Exam findings discussed. -Patient states she would not like to wait for results. -Provider informed that would contact regarding any additional abnormal findings, but that we need to await for a reactive NST. -Will send Rx for Terazol 3 to pharmacy on file. -Will await for reactive NST-OJ given.   Follow Up (11:42 AM) -Nurse informs provider that patient has left AMA. -All paperwork signed.  -Unable to send rx as patient has multiple pharmacies on file.  Cherre RobinsJessica L Kadence Mimbs MSN, CNM 12/23/2018, 11:03 AM

## 2018-12-23 NOTE — Progress Notes (Signed)
Given apple juice to in hopes to increase FM and meet criteria for reactive NST.

## 2018-12-24 LAB — GC/CHLAMYDIA PROBE AMP (~~LOC~~) NOT AT ARMC
Chlamydia: NEGATIVE
Neisseria Gonorrhea: NEGATIVE

## 2019-01-01 ENCOUNTER — Ambulatory Visit (INDEPENDENT_AMBULATORY_CARE_PROVIDER_SITE_OTHER): Payer: Medicaid Other | Admitting: Family Medicine

## 2019-01-01 ENCOUNTER — Other Ambulatory Visit: Payer: Self-pay

## 2019-01-01 ENCOUNTER — Encounter: Payer: Self-pay | Admitting: Family Medicine

## 2019-01-01 DIAGNOSIS — Z3491 Encounter for supervision of normal pregnancy, unspecified, first trimester: Secondary | ICD-10-CM

## 2019-01-01 DIAGNOSIS — O2600 Excessive weight gain in pregnancy, unspecified trimester: Secondary | ICD-10-CM

## 2019-01-01 HISTORY — DX: Excessive weight gain in pregnancy, unspecified trimester: O26.00

## 2019-01-01 NOTE — Patient Instructions (Signed)
It was wonderful to see you today.  Thank you for choosing [facility].   Please call [phone] with any questions about today's appointment.  Please be sure to schedule follow up at the front  desk before you leave today.   Carina Brown, MD  Family Medicine    

## 2019-01-01 NOTE — Progress Notes (Signed)
  Patient Name: Stacey [name] Date of Birth: Nov 02, 1995 Date of Visit: 01/01/19 PCP: [name], MD  Chief Complaint: prenatal care  Subjective: Stacey [name] is a pleasant [phone]. She is currently dated by an inaccurate LMP. This is discordant with her 7w ultrasound. Discussed re-dating with her today.  LMP 11/23 which was based upon irregular cycle of 2 days, EDD 8/29 [redacted]w[redacted]d USG gives EDD of 02/20/19  (6 days off)  Patient accepting and understanding of re-dating and new due date discussed.  She has no unusual complaints today. Reports good fetal movement. Denies contractions, loss of fluid, or bleeding. Reports normal discharge. Has intermittent back pain- 1-2 times per week, non radiating, relieved by change in position.    ROS:   I have reviewed the patient's medical, surgical, family, and social history as appropriate.   Pertinent PMH: ADHD and depression   Prior Obstetric History: One prior SAB One prior TAB   Complications of Current Pregnancy: History of depression. Patient denies SI/HI today. She reports she enough family support. Lives with grandmother.    Vitals:   01/01/19 1155  BP: (!) 90/52  Pulse: 90   Last Weight  Most recent update: 01/01/2019 11:55 AM   Weight  71.3 kg (157 lb 3.2 oz)            See prenatal flow sheet for exam.   [place] was seen today for routine prenatal visit.  Diagnoses and all orders for this visit:  Supervision of low-risk pregnancy, first trimester Third trimester labs reviewed.  Hemoglobin is 10.9.  Denies symptoms of anemia. Will retake quad screen based upon dating from 7-week ultrasound just discrepant from last menstrual period by 6 days Discussed mood disorder with patient.  She reports she is doing well now.  She has a lot of family support.  Her grandmother mother will be helping her with the baby.  Father baby is not involved. Recommended she continue to take the prenatal vitamin.    Anticipatory Guidance and Prenatal Education provided on the following topics: - Preterm labor signs  - Reasons to present to MAU - Nutrition in pregnancy - Contraception postpartum - Breastfeed - Safe sleep for infant   Dorris Singh, MD  [facility] Medicine Teaching Service

## 2019-01-02 ENCOUNTER — Telehealth: Payer: Self-pay | Admitting: Family Medicine

## 2019-01-02 NOTE — Telephone Encounter (Signed)
Called patient regarding quad screen. This was collected at 15w5 by LMP, which is not accurate. Redated by 7w ultrasound, which places her at [redacted]w[redacted]d at time quad was drawn. This is unable to be recalculated as test invalid before 15w.   Discussed all options including referral to Genetics, further discussion, no action. Patient would like to think about this and discuss further at follow up with Dr. Ouida Sills.   Routing to PCP and provider seeing patient next as FYI.  Dorris Singh, MD  Family Medicine Teaching Service

## 2019-01-14 ENCOUNTER — Ambulatory Visit (INDEPENDENT_AMBULATORY_CARE_PROVIDER_SITE_OTHER): Payer: Medicaid Other | Admitting: Family Medicine

## 2019-01-14 ENCOUNTER — Other Ambulatory Visit: Payer: Self-pay

## 2019-01-14 VITALS — BP 100/52 | HR 97 | Wt 159.2 lb

## 2019-01-14 DIAGNOSIS — Z3483 Encounter for supervision of other normal pregnancy, third trimester: Secondary | ICD-10-CM | POA: Diagnosis not present

## 2019-01-14 DIAGNOSIS — R399 Unspecified symptoms and signs involving the genitourinary system: Secondary | ICD-10-CM | POA: Diagnosis not present

## 2019-01-14 LAB — POCT UA - MICROSCOPIC ONLY

## 2019-01-14 LAB — POCT URINALYSIS DIP (MANUAL ENTRY)
Bilirubin, UA: NEGATIVE
Blood, UA: NEGATIVE
Glucose, UA: NEGATIVE mg/dL
Ketones, POC UA: NEGATIVE mg/dL
Nitrite, UA: NEGATIVE
Protein Ur, POC: NEGATIVE mg/dL
Spec Grav, UA: 1.015 (ref 1.010–1.025)
Urobilinogen, UA: 0.2 E.U./dL
pH, UA: 8.5 — AB (ref 5.0–8.0)

## 2019-01-14 NOTE — Progress Notes (Signed)
  Subjective:    Stacey [name] is a 23 y.o. G3P0020 [redacted]w[redacted]d being seen today for her obstetrical visit.  Patient reports no bleeding and no contractions. Fetal movement: normal.  Objective:    BP (!) 100/52   Pulse 97   Wt 159 lb 4 oz (72.2 kg)   LMP 05/10/2018   BMI 24.94 kg/m   Physical Exam  Constitutional: She appears well-developed.  Cardiovascular: Normal rate, regular rhythm and normal heart sounds.  No murmur heard. Respiratory: Effort normal and breath sounds normal. No respiratory distress.  GI: Soft. Bowel sounds are normal. She exhibits no distension.  Musculoskeletal: Normal range of motion.        General: No edema.  Neurological: She is alert.      FHT:  145  Uterine Size:  33     Assessment:    Pregnancy:  G3P0020    Plan:    Patient Active Problem List   Diagnosis Date Noted  . Low weight gain in pregnancy  01/01/2019  . Second trimester bleeding 11/11/2018  . Prenatal care in second trimester 08/28/2018  . Supervision of low-risk pregnancy, first trimester 07/02/2018  . Seasonal allergic rhinitis 03/19/2018  . Other constipation 06/26/2013  . Mood disorder ([place]) 06/24/2012    Follow up in 1 Week.     Milus Banister, [place], PGY-2 01/14/2019 6:32 PM

## 2019-01-14 NOTE — Patient Instructions (Addendum)
Third Trimester of Pregnancy The third trimester is from week 28 through week 40 (months 7 through 9). The third trimester is a time when the unborn baby (fetus) is growing rapidly. At the end of the ninth month, the fetus is about 20 inches in length and weighs 6-10 pounds. Body changes during your third trimester Your body will continue to go through many changes during pregnancy. The changes vary from woman to woman. During the third trimester:  Your weight will continue to increase. You can expect to gain 25-35 pounds (11-16 kg) by the end of the pregnancy.  You may begin to get stretch marks on your hips, abdomen, and breasts.  You may urinate more often because the fetus is moving lower into your pelvis and pressing on your bladder.  You may develop or continue to have heartburn. This is caused by increased hormones that slow down muscles in the digestive tract.  You may develop or continue to have constipation because increased hormones slow digestion and cause the muscles that push waste through your intestines to relax.  You may develop hemorrhoids. These are swollen veins (varicose veins) in the rectum that can itch or be painful.  You may develop swollen, bulging veins (varicose veins) in your legs.  You may have increased body aches in the pelvis, back, or thighs. This is due to weight gain and increased hormones that are relaxing your joints.  You may have changes in your hair. These can include thickening of your hair, rapid growth, and changes in texture. Some women also have hair loss during or after pregnancy, or hair that feels dry or thin. Your hair will most likely return to normal after your baby is born.  Your breasts will continue to grow and they will continue to become tender. A yellow fluid (colostrum) may leak from your breasts. This is the first milk you are producing for your baby.  Your belly button may stick out.  You may notice more swelling in your hands,  face, or ankles.  You may have increased tingling or numbness in your hands, arms, and legs. The skin on your belly may also feel numb.  You may feel short of breath because of your expanding uterus.  You may have more problems sleeping. This can be caused by the size of your belly, increased need to urinate, and an increase in your body's metabolism.  You may notice the fetus "dropping," or moving lower in your abdomen (lightening).  You may have increased vaginal discharge.  You may notice your joints feel loose and you may have pain around your pelvic bone. What to expect at prenatal visits You will have prenatal exams every 2 weeks until week 36. Then you will have weekly prenatal exams. During a routine prenatal visit:  You will be weighed to make sure you and the baby are growing normally.  Your blood pressure will be taken.  Your abdomen will be measured to track your baby's growth.  The fetal heartbeat will be listened to.  Any test results from the previous visit will be discussed.  You may have a cervical check near your due date to see if your cervix has softened or thinned (effaced).  You will be tested for Group B streptococcus. This happens between 35 and 37 weeks. Your health care provider may ask you:  What your birth plan is.  How you are feeling.  If you are feeling the baby move.  If you have had any abnormal   symptoms, such as leaking fluid, bleeding, severe headaches, or abdominal cramping.  If you are using any tobacco products, including cigarettes, chewing tobacco, and electronic cigarettes.  If you have any questions. Other tests or screenings that may be performed during your third trimester include:  Blood tests that check for low iron levels (anemia).  Fetal testing to check the health, activity level, and growth of the fetus. Testing is done if you have certain medical conditions or if there are problems during the pregnancy.  Nonstress test  (NST). This test checks the health of your baby to make sure there are no signs of problems, such as the baby not getting enough oxygen. During this test, a belt is placed around your belly. The baby is made to move, and its heart rate is monitored during movement. What is false labor? False labor is a condition in which you feel small, irregular tightenings of the muscles in the womb (contractions) that usually go away with rest, changing position, or drinking water. These are called Braxton Hicks contractions. Contractions may last for hours, days, or even weeks before true labor sets in. If contractions come at regular intervals, become more frequent, increase in intensity, or become painful, you should see your health care provider. What are the signs of labor?  Abdominal cramps.  Regular contractions that start at 10 minutes apart and become stronger and more frequent with time.  Contractions that start on the top of the uterus and spread down to the lower abdomen and back.  Increased pelvic pressure and dull back pain.  A watery or bloody mucus discharge that comes from the vagina.  Leaking of amniotic fluid. This is also known as your "water breaking." It could be a slow trickle or a gush. Let your health care provider know if it has a color or strange odor. If you have any of these signs, call your health care provider right away, even if it is before your due date. Follow these instructions at home: Medicines  Follow your health care provider's instructions regarding medicine use. Specific medicines may be either safe or unsafe to take during pregnancy.  Take a prenatal vitamin that contains at least 600 micrograms (mcg) of folic acid.  If you develop constipation, try taking a stool softener if your health care provider approves. Eating and drinking   Eat a balanced diet that includes fresh fruits and vegetables, whole grains, good sources of protein such as meat, eggs, or tofu,  and low-fat dairy. Your health care provider will help you determine the amount of weight gain that is right for you.  Avoid raw meat and uncooked cheese. These carry germs that can cause birth defects in the baby.  If you have low calcium intake from food, talk to your health care provider about whether you should take a daily calcium supplement.  Eat four or five small meals rather than three large meals a day.  Limit foods that are high in fat and processed sugars, such as fried and sweet foods.  To prevent constipation: ? Drink enough fluid to keep your urine clear or pale yellow. ? Eat foods that are high in fiber, such as fresh fruits and vegetables, whole grains, and beans. Activity  Exercise only as directed by your health care provider. Most women can continue their usual exercise routine during pregnancy. Try to exercise for 30 minutes at least 5 days a week. Stop exercising if you experience uterine contractions.  Avoid heavy lifting.  Do   not exercise in extreme heat or humidity, or at high altitudes.  Wear low-heel, comfortable shoes.  Practice good posture.  You may continue to have sex unless your health care provider tells you otherwise. Relieving pain and discomfort  Take frequent breaks and rest with your legs elevated if you have leg cramps or low back pain.  Take warm sitz baths to soothe any pain or discomfort caused by hemorrhoids. Use hemorrhoid cream if your health care provider approves.  Wear a good support bra to prevent discomfort from breast tenderness.  If you develop varicose veins: ? Wear support pantyhose or compression stockings as told by your healthcare provider. ? Elevate your feet for 15 minutes, 3-4 times a day. Prenatal care  Write down your questions. Take them to your prenatal visits.  Keep all your prenatal visits as told by your health care provider. This is important. Safety  Wear your seat belt at all times when driving.  Make  a list of emergency phone numbers, including numbers for family, friends, the hospital, and police and fire departments. General instructions  Avoid cat litter boxes and soil used by cats. These carry germs that can cause birth defects in the baby. If you have a cat, ask someone to clean the litter box for you.  Do not travel far distances unless it is absolutely necessary and only with the approval of your health care provider.  Do not use hot tubs, steam rooms, or saunas.  Do not drink alcohol.  Do not use any products that contain nicotine or tobacco, such as cigarettes and e-cigarettes. If you need help quitting, ask your health care provider.  Do not use any medicinal herbs or unprescribed drugs. These chemicals affect the formation and growth of the baby.  Do not douche or use tampons or scented sanitary pads.  Do not cross your legs for long periods of time.  To prepare for the arrival of your baby: ? Take prenatal classes to understand, practice, and ask questions about labor and delivery. ? Make a trial run to the hospital. ? Visit the hospital and tour the maternity area. ? Arrange for maternity or paternity leave through employers. ? Arrange for family and friends to take care of pets while you are in the hospital. ? Purchase a rear-facing car seat and make sure you know how to install it in your car. ? Pack your hospital bag. ? Prepare the baby's nursery. Make sure to remove all pillows and stuffed animals from the baby's crib to prevent suffocation.  Visit your dentist if you have not gone during your pregnancy. Use a soft toothbrush to brush your teeth and be gentle when you floss. Contact a health care provider if:  You are unsure if you are in labor or if your water has broken.  You become dizzy.  You have mild pelvic cramps, pelvic pressure, or nagging pain in your abdominal area.  You have lower back pain.  You have persistent nausea, vomiting, or diarrhea.   You have an unusual or bad smelling vaginal discharge.  You have pain when you urinate. Get help right away if:  Your water breaks before 37 weeks.  You have regular contractions less than 5 minutes apart before 37 weeks.  You have a fever.  You are leaking fluid from your vagina.  You have spotting or bleeding from your vagina.  You have severe abdominal pain or cramping.  You have rapid weight loss or weight gain.  You have   shortness of breath with chest pain.  You notice sudden or extreme swelling of your face, hands, ankles, feet, or legs.  Your baby makes fewer than 10 movements in 2 hours.  You have severe headaches that do not go away when you take medicine.  You have vision changes. Summary  The third trimester is from week 28 through week 40, months 7 through 9. The third trimester is a time when the unborn baby (fetus) is growing rapidly.  During the third trimester, your discomfort may increase as you and your baby continue to gain weight. You may have abdominal, leg, and back pain, sleeping problems, and an increased need to urinate.  During the third trimester your breasts will keep growing and they will continue to become tender. A yellow fluid (colostrum) may leak from your breasts. This is the first milk you are producing for your baby.  False labor is a condition in which you feel small, irregular tightenings of the muscles in the womb (contractions) that eventually go away. These are called Braxton Hicks contractions. Contractions may last for hours, days, or even weeks before true labor sets in.  Signs of labor can include: abdominal cramps; regular contractions that start at 10 minutes apart and become stronger and more frequent with time; watery or bloody mucus discharge that comes from the vagina; increased pelvic pressure and dull back pain; and leaking of amniotic fluid. This information is not intended to replace advice given to you by your health  care provider. Make sure you discuss any questions you have with your health care provider. Document Released: 05/29/2001 Document Revised: 09/25/2018 Document Reviewed: 07/10/2016 Elsevier Patient Education  2020 [employer]  

## 2019-01-14 NOTE — Telephone Encounter (Signed)
Patient was seen in clinic today and states she would not like to take any action regarding genetic follow up. Patient was informed that if she were to change her mind she can call the clinic and we can start the process/referral.  Milus Banister, [place], PGY-2 01/14/2019 3:18 PM

## 2019-01-23 ENCOUNTER — Ambulatory Visit (INDEPENDENT_AMBULATORY_CARE_PROVIDER_SITE_OTHER): Payer: Medicaid Other | Admitting: Student in an Organized Health Care Education/Training Program

## 2019-01-23 ENCOUNTER — Other Ambulatory Visit: Payer: Self-pay

## 2019-01-23 VITALS — BP 104/58 | HR 87 | Wt 163.4 lb

## 2019-01-23 DIAGNOSIS — Z3491 Encounter for supervision of normal pregnancy, unspecified, first trimester: Secondary | ICD-10-CM

## 2019-01-23 DIAGNOSIS — Z3493 Encounter for supervision of normal pregnancy, unspecified, third trimester: Secondary | ICD-10-CM

## 2019-01-23 NOTE — Patient Instructions (Addendum)
It was a pleasure to see you today!  To summarize our discussion for this visit:  Your baby is head down and ready to meet you!  You're doing great.   We will let you know your results of your swab when you come for your next appointment  Some additional health maintenance measures we should update are: Health Maintenance Due  Topic Date Due  . INFLUENZA VACCINE  01/17/2019  .    Please return to our clinic to see me 1 week.  Call the clinic at [phone] if your symptoms worsen or you have any concerns.   Thank you for allowing me to take part in your care,  Dr. Jamelle Rushinghelsey Mussa Groesbeck   [facility] Contractions Contractions of the uterus can occur throughout pregnancy, but they are not always a sign that you are in labor. You may have practice contractions called Braxton Hicks contractions. These false labor contractions are sometimes confused with true labor. What are Deberah PeltonBraxton Hicks contractions? Braxton Hicks contractions are tightening movements that occur in the muscles of the uterus before labor. Unlike true labor contractions, these contractions do not result in opening (dilation) and thinning of the cervix. Toward the end of pregnancy (32-34 weeks), Braxton Hicks contractions can happen more often and may become stronger. These contractions are sometimes difficult to tell apart from true labor because they can be very uncomfortable. You should not feel embarrassed if you go to the hospital with false labor. Sometimes, the only way to tell if you are in true labor is for your health care provider to look for changes in the cervix. The health care provider will do a physical exam and may monitor your contractions. If you are not in true labor, the exam should show that your cervix is not dilating and your water has not broken. If there are no other health problems associated with your pregnancy, it is completely safe for you to be sent home with false labor. You may continue to have  Braxton Hicks contractions until you go into true labor. How to tell the difference between true labor and false labor True labor  Contractions last 30-70 seconds.  Contractions become very regular.  Discomfort is usually felt in the top of the uterus, and it spreads to the lower abdomen and low back.  Contractions do not go away with walking.  Contractions usually become more intense and increase in frequency.  The cervix dilates and gets thinner. False labor  Contractions are usually shorter and not as strong as true labor contractions.  Contractions are usually irregular.  Contractions are often felt in the front of the lower abdomen and in the groin.  Contractions may go away when you walk around or change positions while lying down.  Contractions get weaker and are shorter-lasting as time goes on.  The cervix usually does not dilate or become thin. Follow these instructions at home:   Take over-the-counter and prescription medicines only as told by your health care provider.  Keep up with your usual exercises and follow other instructions from your health care provider.  Eat and drink lightly if you think you are going into labor.  If Braxton Hicks contractions are making you uncomfortable: ? Change your position from lying down or resting to walking, or change from walking to resting. ? Sit and rest in a tub of warm water. ? Drink enough fluid to keep your urine pale yellow. Dehydration may cause these contractions. ? Do slow and deep breathing several  times an hour.  Keep all follow-up prenatal visits as told by your health care provider. This is important. Contact a health care provider if:  You have a fever.  You have continuous pain in your abdomen. Get help right away if:  Your contractions become stronger, more regular, and closer together.  You have fluid leaking or gushing from your vagina.  You pass blood-tinged mucus (bloody show).  You have  bleeding from your vagina.  You have low back pain that you never had before.  You feel your baby's head pushing down and causing pelvic pressure.  Your baby is not moving inside you as much as it used to. Summary  Contractions that occur before labor are called Braxton Hicks contractions, false labor, or practice contractions.  Braxton Hicks contractions are usually shorter, weaker, farther apart, and less regular than true labor contractions. True labor contractions usually become progressively stronger and regular, and they become more frequent.  Manage discomfort from [facility] contractions by changing position, resting in a warm bath, drinking plenty of water, or practicing deep breathing. This information is not intended to replace advice given to you by your health care provider. Make sure you discuss any questions you have with your health care provider. Document Released: 10/18/2016 Document Revised: 05/17/2017 Document Reviewed: 10/18/2016 Elsevier Patient Education  2020 [employer].

## 2019-01-23 NOTE — Progress Notes (Signed)
  Patient Name: Stacey [name] Date of Birth: [birth year] Date of Visit: 01/23/19 PCP: [name], MD  Chief Complaint: prenatal care  Subjective: [name] is a pleasant G3P0020 at  [redacted]w[redacted]d dated byan inaccurate LMP. This is discordant with her 7w ultrasound. She has no unusual complaints today. Reports good fetal movement. Denies  loss of fluid, or bleeding. She does have mild cramping every few hours some days but it is not consistent. We discussed the signs to look out for meaning she is in labor and techniques she can use to stop the braxton hicks.  We performed an ultrasounds today to confirm vertex position.   ROS:  ROS  I have reviewed the patient's medical, surgical, family, and social history as appropriate.   Pertinent PMH: ADHD and depression  Prior Obstetric History:  One prior SAB One prior TAB   Complications of Current Pregnancy: History of depression. Patient denies SI/HI today. She reports she enough family support. Lives with grandmother.  Vitals:   01/23/19 1356  BP: (!) 104/58  Pulse: 87   Last Weight  Most recent update: 01/23/2019  1:59 PM   Weight  74.1 kg (163 lb 6.4 oz)           139 lb (63 kg) 21.77 Filed Weights   01/23/19 1356  Weight: 163 lb 6.4 oz (74.1 kg)    [name] was seen today for routine prenatal visit.  Diagnoses and all orders for this visit:  Third trimester pregnancy -     Culture, beta strep (group b only)  Supervision of low-risk pregnancy, third trimester Third trimester uncomplicated Confirmed that baby is vertex today occiput posterior.  Obtained GBS swab.  Will discuss results of swab at next appointment in about 1 week.  Anticipatory Guidance and Prenatal Education provided on the following topics: - Preterm labor signs - Reasons to present to MAU - Nutrition in pregnancy - Contraception postpartum - Breastfeed - Safe sleep for infant   Doristine Mango, DO Family Medicine Teaching  Service

## 2019-01-23 NOTE — Assessment & Plan Note (Signed)
Third trimester uncomplicated Confirmed that baby is vertex today occiput posterior.  Obtained GBS swab.  Will discuss results of swab at next appointment in about 1 week.

## 2019-01-27 LAB — CULTURE, BETA STREP (GROUP B ONLY): Strep Gp B Culture: NEGATIVE

## 2019-01-29 NOTE — Progress Notes (Signed)
Stacey [name] is a 23 y.o. G3P0020 at [redacted]w[redacted]d here for routine follow up.  She reports positive fetal movement, no leakage of fluid, no vaginal discharge, no bleeding.  Does report occasional Braxton Hicks contractions. See flow sheet for details.   Physical Exam: Female genitalia: normal external genitalia, vulva, vagina, cervix, uterus and adnexa. Closed cervix, thick. White discharge noted   A/P: Pregnancy at [redacted]w[redacted]d. Doing well.   Pregnancy issues include:  Depression Patient reports that she feels very well supported female.  States that she feels like she is in a better place.  Invalid quad screen Quad screen was collected at 15w5 by LMP, which is not accurate. Redated by 7w ultrasound, [redacted]w[redacted]d at time quad was drawn. This is unable to be recalculated as test invalid before 15w.   Anemia Hgb of 10.9.  Patient reports that she is taking her prenatal vitamins regularly as well as eating iron rich foods.  H/o 2nd trimester bleeding Resolved, no further bleeding  Trichomonas during pregnancy Had TOC  Infant feeding choice: breast  Contraception choice: Nexplanon  Infant circumcision desired: yes (outpt)  GBS and gc/chlamydia testing results were reviewed today.  Negative GBS, will obtain GC/chlamydia testing today Labor and fetal movement precautions reviewed. Follow up 1 week.

## 2019-01-30 ENCOUNTER — Ambulatory Visit (INDEPENDENT_AMBULATORY_CARE_PROVIDER_SITE_OTHER): Payer: Medicaid Other | Admitting: Family Medicine

## 2019-01-30 ENCOUNTER — Other Ambulatory Visit: Payer: Self-pay | Admitting: Family Medicine

## 2019-01-30 ENCOUNTER — Other Ambulatory Visit (HOSPITAL_COMMUNITY)
Admission: RE | Admit: 2019-01-30 | Discharge: 2019-01-30 | Disposition: A | Discharge status: Home / Self Care | Payer: Medicaid Other | Source: Ambulatory Visit | Attending: Family Medicine | Admitting: Family Medicine

## 2019-01-30 ENCOUNTER — Other Ambulatory Visit: Payer: Self-pay

## 2019-01-30 ENCOUNTER — Encounter: Payer: Medicaid Other | Admitting: Family Medicine

## 2019-01-30 VITALS — BP 120/70 | HR 68 | Wt 169.0 lb

## 2019-01-30 DIAGNOSIS — Z3A37 37 weeks gestation of pregnancy: Secondary | ICD-10-CM

## 2019-01-30 DIAGNOSIS — Z3493 Encounter for supervision of normal pregnancy, unspecified, third trimester: Secondary | ICD-10-CM

## 2019-01-30 DIAGNOSIS — Z3491 Encounter for supervision of normal pregnancy, unspecified, first trimester: Secondary | ICD-10-CM | POA: Diagnosis not present

## 2019-01-30 LAB — POCT WET PREP (WET MOUNT)
Clue Cells Wet Prep Whiff POC: NEGATIVE
Trichomonas Wet Prep HPF POC: ABSENT

## 2019-01-30 MED ORDER — MICONAZOLE NITRATE 2 % VA CREA: 1.0000 | TOPICAL_CREAM | Freq: Every day | VAGINAL | 0 refills | Status: AC

## 2019-01-30 NOTE — Addendum Note (Signed)
Addended by: Eppie Gibson on: 01/30/2019 05:02 PM   Modules accepted: Orders

## 2019-01-30 NOTE — Patient Instructions (Signed)

## 2019-02-03 LAB — CERVICOVAGINAL ANCILLARY ONLY
Bacterial vaginitis: NEGATIVE
Candida vaginitis: POSITIVE — AB
Chlamydia: NEGATIVE
Neisseria Gonorrhea: NEGATIVE
Trichomonas: NEGATIVE

## 2019-02-06 ENCOUNTER — Other Ambulatory Visit: Payer: Self-pay

## 2019-02-06 ENCOUNTER — Ambulatory Visit (INDEPENDENT_AMBULATORY_CARE_PROVIDER_SITE_OTHER): Payer: Medicaid Other | Admitting: Family Medicine

## 2019-02-06 VITALS — BP 90/54 | Wt 165.1 lb

## 2019-02-06 DIAGNOSIS — R634 Abnormal weight loss: Secondary | ICD-10-CM

## 2019-02-06 DIAGNOSIS — Z3493 Encounter for supervision of normal pregnancy, unspecified, third trimester: Secondary | ICD-10-CM | POA: Diagnosis not present

## 2019-02-06 DIAGNOSIS — B379 Candidiasis, unspecified: Secondary | ICD-10-CM | POA: Diagnosis not present

## 2019-02-06 NOTE — Progress Notes (Signed)
Stacey [name] is a 23 y.o. G3P0020 at [redacted]w[redacted]d here for routine follow up.  She reports no bleeding, LOF. Having irregular contractions. Feeling baby move.  See flow sheet for details.  Physical: GYN:  External genitalia within normal limits.  Vaginal mucosa pink, moist, normal rugae.  Nonfriable cervix without lesions, fingertip dilated.  Copious amount of white discharge noted on speculum exam.  No bleeding.    Vertex presentation confirmed by bedside ultrasound today.  A/P: Pregnancy at [redacted]w[redacted]d. Doing well.   Pregnancy issues include: - mood disorder with depression - w/ h/o suicide attempt and ADHD, previously followed by [facility]. Not been on meds since high school.  Doing well without any current mood issues.  Reports good relationships with family at home. - anemia in pregnancy - Hb 10.9. Taking PNV and eating iron rich foods. No CP, SOB. - invalid quad screen - h/o trichomonas during pregnancy, resolved with negative TOC - 2nd trimester bleeding, resolved - Yeast infection in pregnancy - not having symptoms.  Has not yet filled miconazole prescription, encouraged patient to do this.   - Weight loss - noted 4 pound loss since last week.  Fundal height measurement appropriate.  No red flags.  Likely due to near end of pregnancy, will continue to monitor.  Infant feeding choice: breast Contraception choice: nexplanon Infant circumcision desired: yes, outpt   GBS and gc/chlamydia testing results were reviewed today and negative.  Labor and fetal movement precautions reviewed. Follow up 1 week.

## 2019-02-06 NOTE — Patient Instructions (Signed)
It was great to see you!  Our plans for today:  - You are about fingertip dilation. If your contractions pick up, start to time them. Once they reach about 2-4 minutes apart consistently or if you have bleeding or leakage of fluid, you should go to the Maternity Admissions Unit at [facility]. - Pick up miconazole from the pharmacy for your yeast infection.   Take care and seek immediate care sooner if you develop any concerns.   Dr. Mollie Germanyumball [facility]   Vaginal Delivery  Vaginal delivery means that you give birth by pushing your baby out of your birth canal (vagina). A team of health care providers will help you before, during, and after vaginal delivery. Birth experiences are unique for every woman and every pregnancy, and birth experiences vary depending on where you choose to give birth. What happens when I arrive at the birth center or hospital? Once you are in labor and have been admitted into the hospital or birth center, your health care provider may:  Review your pregnancy history and any concerns that you have.  Insert an IV into one of your veins. This may be used to give you fluids and medicines.  Check your blood pressure, pulse, temperature, and heart rate (vital signs).  Check whether your bag of water (amniotic sac) has broken (ruptured).  Talk with you about your birth plan and discuss pain control options. Monitoring Your health care provider may monitor your contractions (uterine monitoring) and your baby's heart rate (fetal monitoring). You may need to be monitored:  Often, but not continuously (intermittently).  All the time or for long periods at a time (continuously). Continuous monitoring may be needed if: ? You are taking certain medicines, such as medicine to relieve pain or make your contractions stronger. ? You have pregnancy or labor complications. Monitoring may be done by:  Placing a special stethoscope or a handheld monitoring  device on your abdomen to check your baby's heartbeat and to check for contractions.  Placing monitors on your abdomen (external monitors) to record your baby's heartbeat and the frequency and length of contractions.  Placing monitors inside your uterus through your vagina (internal monitors) to record your baby's heartbeat and the frequency, length, and strength of your contractions. Depending on the type of monitor, it may remain in your uterus or on your baby's head until birth.  Telemetry. This is a type of continuous monitoring that can be done with external or internal monitors. Instead of having to stay in bed, you are able to move around during telemetry. Physical exam Your health care provider may perform frequent physical exams. This may include:  Checking how and where your baby is positioned in your uterus.  Checking your cervix to determine: ? Whether it is thinning out (effacing). ? Whether it is opening up (dilating). What happens during labor and delivery?  Normal labor and delivery is divided into the following three stages: Stage 1  This is the longest stage of labor.  This stage can last for hours or days.  Throughout this stage, you will feel contractions. Contractions generally feel mild, infrequent, and irregular at first. They get stronger, more frequent (about every 2-3 minutes), and more regular as you move through this stage.  This stage ends when your cervix is completely dilated to 4 inches (10 cm) and completely effaced. Stage 2  This stage starts once your cervix is completely effaced and dilated and lasts until the delivery  of your baby.  This stage may last from 20 minutes to 2 hours.  This is the stage where you will feel an urge to push your baby out of your vagina.  You may feel stretching and burning pain, especially when the widest part of your baby's head passes through the vaginal opening (crowning).  Once your baby is delivered, the  umbilical cord will be clamped and cut. This usually occurs after waiting a period of 1-2 minutes after delivery.  Your baby will be placed on your bare chest (skin-to-skin contact) in an upright position and covered with a warm blanket. Watch your baby for feeding cues, like rooting or sucking, and help the baby to your breast for his or her first feeding. Stage 3  This stage starts immediately after the birth of your baby and ends after you deliver the placenta.  This stage may take anywhere from 5 to 30 minutes.  After your baby has been delivered, you will feel contractions as your body expels the placenta and your uterus contracts to control bleeding. What can I expect after labor and delivery?  After labor is over, you and your baby will be monitored closely until you are ready to go home to ensure that you are both healthy. Your health care team will teach you how to care for yourself and your baby.  You and your baby will stay in the same room (rooming in) during your hospital stay. This will encourage early bonding and successful breastfeeding.  You may continue to receive fluids and medicines through an IV.  Your uterus will be checked and massaged regularly (fundal massage).  You will have some soreness and pain in your abdomen, vagina, and the area of skin between your vaginal opening and your anus (perineum).  If an incision was made near your vagina (episiotomy) or if you had some vaginal tearing during delivery, cold compresses may be placed on your episiotomy or your tear. This helps to reduce pain and swelling.  You may be given a squirt bottle to use instead of wiping when you go to the bathroom. To use the squirt bottle, follow these steps: ? Before you urinate, fill the squirt bottle with warm water. Do not use hot water. ? After you urinate, while you are sitting on the toilet, use the squirt bottle to rinse the area around your urethra and vaginal opening. This rinses  away any urine and blood. ? Fill the squirt bottle with clean water every time you use the bathroom.  It is normal to have vaginal bleeding after delivery. Wear a sanitary pad for vaginal bleeding and discharge. Summary  Vaginal delivery means that you will give birth by pushing your baby out of your birth canal (vagina).  Your health care provider may monitor your contractions (uterine monitoring) and your baby's heart rate (fetal monitoring).  Your health care provider may perform a physical exam.  Normal labor and delivery is divided into three stages.  After labor is over, you and your baby will be monitored closely until you are ready to go home. This information is not intended to replace advice given to you by your health care provider. Make sure you discuss any questions you have with your health care provider. Document Released: 03/13/2008 Document Revised: 07/09/2017 Document Reviewed: 07/09/2017 Elsevier Patient Education  2020 [employer].

## 2019-02-07 ENCOUNTER — Encounter (HOSPITAL_COMMUNITY): Payer: Self-pay | Admitting: *Deleted

## 2019-02-07 ENCOUNTER — Inpatient Hospital Stay (HOSPITAL_COMMUNITY)
Admission: AD | Admit: 2019-02-07 | Discharge: 2019-02-09 | DRG: 807 | Disposition: A | Discharge status: Home / Self Care | Payer: Medicaid Other | Attending: Obstetrics and Gynecology | Admitting: Obstetrics and Gynecology

## 2019-02-07 ENCOUNTER — Other Ambulatory Visit: Payer: Self-pay

## 2019-02-07 ENCOUNTER — Inpatient Hospital Stay (HOSPITAL_COMMUNITY): Payer: Medicaid Other | Admitting: Anesthesiology

## 2019-02-07 DIAGNOSIS — F4310 Post-traumatic stress disorder, unspecified: Secondary | ICD-10-CM | POA: Diagnosis present

## 2019-02-07 DIAGNOSIS — Z880 Allergy status to penicillin: Secondary | ICD-10-CM | POA: Diagnosis not present

## 2019-02-07 DIAGNOSIS — Z30017 Encounter for initial prescription of implantable subdermal contraceptive: Secondary | ICD-10-CM | POA: Diagnosis not present

## 2019-02-07 DIAGNOSIS — O26893 Other specified pregnancy related conditions, third trimester: Secondary | ICD-10-CM | POA: Diagnosis present

## 2019-02-07 DIAGNOSIS — Z20828 Contact with and (suspected) exposure to other viral communicable diseases: Secondary | ICD-10-CM | POA: Diagnosis present

## 2019-02-07 DIAGNOSIS — Z3A38 38 weeks gestation of pregnancy: Secondary | ICD-10-CM

## 2019-02-07 DIAGNOSIS — O99344 Other mental disorders complicating childbirth: Secondary | ICD-10-CM | POA: Diagnosis present

## 2019-02-07 DIAGNOSIS — F39 Unspecified mood [affective] disorder: Secondary | ICD-10-CM | POA: Diagnosis present

## 2019-02-07 DIAGNOSIS — Z87891 Personal history of nicotine dependence: Secondary | ICD-10-CM

## 2019-02-07 DIAGNOSIS — Z975 Presence of (intrauterine) contraceptive device: Secondary | ICD-10-CM

## 2019-02-07 LAB — CBC
HCT: 37.1 % (ref 36.0–46.0)
Hemoglobin: 12.4 g/dL (ref 12.0–15.0)
MCH: 30.8 pg (ref 26.0–34.0)
MCHC: 33.4 g/dL (ref 30.0–36.0)
MCV: 92.1 fL (ref 80.0–100.0)
Platelets: 267 10*3/uL (ref 150–400)
RBC: 4.03 MIL/uL (ref 3.87–5.11)
RDW: 14 % (ref 11.5–15.5)
WBC: 11.2 10*3/uL — ABNORMAL HIGH (ref 4.0–10.5)
nRBC: 0 % (ref 0.0–0.2)

## 2019-02-07 LAB — TYPE AND SCREEN
ABO/RH(D): O POS
Antibody Screen: NEGATIVE

## 2019-02-07 LAB — POCT FERN TEST: POCT Fern Test: POSITIVE

## 2019-02-07 LAB — ABO/RH: ABO/RH(D): O POS

## 2019-02-07 LAB — SARS CORONAVIRUS 2 (TAT 6-24 HRS): SARS Coronavirus 2: NEGATIVE

## 2019-02-07 MED ORDER — ZOLPIDEM TARTRATE 5 MG PO TABS: 5.0000 mg | ORAL_TABLET | Freq: Every evening | ORAL | Status: DC | PRN

## 2019-02-07 MED ORDER — COCONUT OIL OIL
1.0000 "application " | TOPICAL_OIL | Status: DC | PRN
  Administered 2019-02-09: 1 via TOPICAL

## 2019-02-07 MED ORDER — FENTANYL CITRATE (PF) 100 MCG/2ML IJ SOLN
INTRAMUSCULAR | Status: AC
  Filled 2019-02-07: qty 2

## 2019-02-07 MED ORDER — FENTANYL CITRATE (PF) 100 MCG/2ML IJ SOLN
100.0000 ug | Freq: Once | INTRAMUSCULAR | Status: AC
  Administered 2019-02-07: 100 ug via INTRAVENOUS

## 2019-02-07 MED ORDER — PHENYLEPHRINE 40 MCG/ML (10ML) SYRINGE FOR IV PUSH (FOR BLOOD PRESSURE SUPPORT): 80.0000 ug | PREFILLED_SYRINGE | INTRAVENOUS | Status: DC | PRN

## 2019-02-07 MED ORDER — OXYCODONE-ACETAMINOPHEN 5-325 MG PO TABS: 2.0000 | ORAL_TABLET | ORAL | Status: DC | PRN

## 2019-02-07 MED ORDER — SOD CITRATE-CITRIC ACID 500-334 MG/5ML PO SOLN: 30.0000 mL | ORAL | Status: DC | PRN

## 2019-02-07 MED ORDER — IBUPROFEN 600 MG PO TABS
600.0000 mg | ORAL_TABLET | Freq: Four times a day (QID) | ORAL | Status: DC
  Administered 2019-02-07 – 2019-02-09 (×7): 600 mg via ORAL
  Filled 2019-02-07 (×7): qty 1

## 2019-02-07 MED ORDER — SIMETHICONE 80 MG PO CHEW: 80.0000 mg | CHEWABLE_TABLET | ORAL | Status: DC | PRN

## 2019-02-07 MED ORDER — EPHEDRINE 5 MG/ML INJ: 10.0000 mg | INTRAVENOUS | Status: DC | PRN

## 2019-02-07 MED ORDER — LACTATED RINGERS IV SOLN: 500.0000 mL | Freq: Once | INTRAVENOUS | Status: DC

## 2019-02-07 MED ORDER — TETANUS-DIPHTH-ACELL PERTUSSIS 5-2.5-18.5 LF-MCG/0.5 IM SUSP: 0.5000 mL | Freq: Once | INTRAMUSCULAR | Status: DC

## 2019-02-07 MED ORDER — ACETAMINOPHEN 325 MG PO TABS: 650.0000 mg | ORAL_TABLET | ORAL | Status: DC | PRN

## 2019-02-07 MED ORDER — ONDANSETRON HCL 4 MG/2ML IJ SOLN: 4.0000 mg | Freq: Four times a day (QID) | INTRAMUSCULAR | Status: DC | PRN

## 2019-02-07 MED ORDER — PHENYLEPHRINE 40 MCG/ML (10ML) SYRINGE FOR IV PUSH (FOR BLOOD PRESSURE SUPPORT)
PREFILLED_SYRINGE | INTRAVENOUS | Status: AC
  Filled 2019-02-07: qty 10

## 2019-02-07 MED ORDER — FENTANYL-BUPIVACAINE-NACL 0.5-0.125-0.9 MG/250ML-% EP SOLN
EPIDURAL | Status: AC
  Filled 2019-02-07: qty 250

## 2019-02-07 MED ORDER — LIDOCAINE HCL (PF) 1 % IJ SOLN
INTRAMUSCULAR | Status: DC | PRN
  Administered 2019-02-07 (×2): 7 mL via EPIDURAL

## 2019-02-07 MED ORDER — ONDANSETRON HCL 4 MG PO TABS: 4.0000 mg | ORAL_TABLET | ORAL | Status: DC | PRN

## 2019-02-07 MED ORDER — WITCH HAZEL-GLYCERIN EX PADS: 1.0000 "application " | MEDICATED_PAD | CUTANEOUS | Status: DC | PRN

## 2019-02-07 MED ORDER — DIPHENHYDRAMINE HCL 50 MG/ML IJ SOLN: 12.5000 mg | INTRAMUSCULAR | Status: DC | PRN

## 2019-02-07 MED ORDER — BENZOCAINE-MENTHOL 20-0.5 % EX AERO
1.0000 "application " | INHALATION_SPRAY | CUTANEOUS | Status: DC | PRN
  Administered 2019-02-07: 1 via TOPICAL
  Filled 2019-02-07: qty 56

## 2019-02-07 MED ORDER — PRENATAL MULTIVITAMIN CH
1.0000 | ORAL_TABLET | Freq: Every day | ORAL | Status: DC
  Administered 2019-02-08 – 2019-02-09 (×2): 1 via ORAL
  Filled 2019-02-07 (×2): qty 1

## 2019-02-07 MED ORDER — OXYTOCIN 40 UNITS IN NORMAL SALINE INFUSION - SIMPLE MED
2.5000 [IU]/h | INTRAVENOUS | Status: DC
  Filled 2019-02-07 (×2): qty 1000

## 2019-02-07 MED ORDER — DIBUCAINE (PERIANAL) 1 % EX OINT: 1.0000 "application " | TOPICAL_OINTMENT | CUTANEOUS | Status: DC | PRN

## 2019-02-07 MED ORDER — SENNOSIDES-DOCUSATE SODIUM 8.6-50 MG PO TABS
2.0000 | ORAL_TABLET | ORAL | Status: DC
  Administered 2019-02-07 – 2019-02-08 (×2): 2 via ORAL
  Filled 2019-02-07 (×2): qty 2

## 2019-02-07 MED ORDER — OXYTOCIN BOLUS FROM INFUSION
500.0000 mL | Freq: Once | INTRAVENOUS | Status: AC
  Administered 2019-02-07: 500 mL via INTRAVENOUS

## 2019-02-07 MED ORDER — DIPHENHYDRAMINE HCL 25 MG PO CAPS: 25.0000 mg | ORAL_CAPSULE | Freq: Four times a day (QID) | ORAL | Status: DC | PRN

## 2019-02-07 MED ORDER — SODIUM CHLORIDE (PF) 0.9 % IJ SOLN
INTRAMUSCULAR | Status: DC | PRN
  Administered 2019-02-07: 12 mL/h via EPIDURAL

## 2019-02-07 MED ORDER — ONDANSETRON HCL 4 MG/2ML IJ SOLN: 4.0000 mg | INTRAMUSCULAR | Status: DC | PRN

## 2019-02-07 MED ORDER — FENTANYL-BUPIVACAINE-NACL 0.5-0.125-0.9 MG/250ML-% EP SOLN: 12.0000 mL/h | EPIDURAL | Status: DC | PRN

## 2019-02-07 MED ORDER — LACTATED RINGERS IV SOLN
INTRAVENOUS | Status: DC
  Administered 2019-02-07 (×2): 125 mL/h via INTRAVENOUS

## 2019-02-07 MED ORDER — LACTATED RINGERS IV SOLN
500.0000 mL | INTRAVENOUS | Status: DC | PRN
  Administered 2019-02-07: 500 mL via INTRAVENOUS

## 2019-02-07 MED ORDER — OXYCODONE-ACETAMINOPHEN 5-325 MG PO TABS: 1.0000 | ORAL_TABLET | ORAL | Status: DC | PRN

## 2019-02-07 MED ORDER — LIDOCAINE HCL (PF) 1 % IJ SOLN
30.0000 mL | INTRAMUSCULAR | Status: AC | PRN
  Administered 2019-02-07: 30 mL via SUBCUTANEOUS
  Filled 2019-02-07: qty 30

## 2019-02-07 NOTE — Discharge Summary (Signed)
Postpartum Discharge Summary     Patient Name: Stacey [name] DOB: 1995/08/29 MRN: [id number]  Date of admission: 02/07/2019 Delivering Provider: Thressa ShellerHOGAN, HEATHER D   Date of discharge: 02/09/2019  Admitting diagnosis: CTX LESS THAN 5 MIN  Intrauterine pregnancy: 2764w1d     Secondary diagnosis:  Active Problems:   Normal labor  Additional problems: none     Discharge diagnosis: Term Pregnancy Delivered                                                                                                Post partum procedures:None  Augmentation: None   Complications: None  Hospital course:  Onset of Labor With Vaginal Delivery     23 y.o. yo G3P0020 at 5364w1d was admitted in Active Labor on 02/07/2019. Patient had an uncomplicated labor course as follows:  Membrane Rupture Time/Date: 11:20 AM ,02/07/2019   Intrapartum Procedures: Episiotomy: None [1]                                         Lacerations:  1st degree [2]  Patient had a delivery of a Viable infant. 02/07/2019  Information for the patient's newborn:  Myrtie CruiseMcClenton, Boy Denean [[id number]][[id number]]       Pateint had an uncomplicated postpartum course.  She is ambulating, tolerating a regular diet, passing flatus, and urinating well. Patient is discharged home in stable condition on 02/09/19.   Magnesium Sulfate recieved: No BMZ received: No  Physical exam  Vitals:   02/08/19 1030 02/08/19 1425 02/08/19 2057 02/09/19 0511  BP: 110/69 105/69 90/67 95/64   Pulse: 95 68 87 61  Resp: 16 16 16 18   Temp: 98.1 F (36.7 C) 98.5 F (36.9 C) (!) 97.5 F (36.4 C) 98.2 F (36.8 C)  TempSrc: Oral Oral Oral Oral  SpO2: 100% 99%  99%  Weight:      Height:       General: alert, cooperative and no distress Lochia: appropriate Uterine Fundus: firm Incision: N/A DVT Evaluation: No evidence of DVT seen on physical exam. Labs: Lab Results  Component Value Date   WBC 12.8 (H) 02/08/2019   HGB 10.4 (L) 02/08/2019   HCT 31.5 (L)  02/08/2019   MCV 92.1 02/08/2019   PLT 211 02/08/2019   CMP Latest Ref Rng & Units 08/17/2018  Glucose 70 - 99 mg/dL 86  BUN 6 - 20 mg/dL <7(W<5(L)  Creatinine 2.950.44 - 1.00 mg/dL 6.210.48  Sodium 308135 - 657145 mmol/L 133(L)  Potassium 3.5 - 5.1 mmol/L 3.4(L)  Chloride 98 - 111 mmol/L 101  CO2 22 - 32 mmol/L 23  Calcium 8.9 - 10.3 mg/dL 9.2  Total Protein 6.5 - 8.1 g/dL 7.5  Total Bilirubin 0.3 - 1.2 mg/dL 0.5  Alkaline Phos 38 - 126 U/L 73  AST 15 - 41 U/L 16  ALT 0 - 44 U/L 11    Discharge instruction: per After Visit Summary and "Baby and Me Booklet".  After visit meds:  Allergies as of 02/09/2019  Reactions   Penicillins Other (See Comments)   Has patient had a PCN reaction causing immediate rash, facial/tongue/throat swelling, SOB or lightheadedness with hypotension: No Has patient had a PCN reaction causing severe rash involving mucus membranes or skin necrosis: No Has patient had a PCN reaction that required hospitalization No Has pt had a PCN reaction in the last 10 years? No. Nearly universal familial allergy. Pt has never taken PCN.       Medication List    TAKE these medications   ibuprofen 600 MG tablet Commonly known as: ADVIL Take 1 tablet (600 mg total) by mouth every 6 (six) hours.   PNV Prenatal Plus Multivitamin 27-1 MG Tabs Take 1 Dose by mouth daily.   polyethylene glycol powder 17 GM/SCOOP powder Commonly known as: GLYCOLAX/MIRALAX Take 17 g by mouth daily.   Prenatal/Iron Tabs Take 1 tablet by mouth every morning.       Diet: No evidence of DVT seen on physical exam.  Activity: Advance as tolerated. Pelvic rest for 6 weeks.   Outpatient follow up:4 weeks Follow up Appt: Future Appointments  Date Time Provider [place]  02/13/2019  2:10 PM Rory Percy, DO [facility] [place]   Follow up Visit:   Please schedule this patient for Postpartum visit in: 6 weeks with the following provider: Any provider For C/S patients schedule nurse  incision check in weeks 2 weeks: no Low risk pregnancy complicated by: None Delivery mode:  SVD Anticipated Birth Control:  Nexplanon PP Procedures needed: None  Schedule Integrated BH visit: no  PP message sent to [facility] clinic      Newborn Data: Live born female  Birth Weight:   APGAR: 30, 9  Newborn Delivery   Birth date/time: 02/07/2019 20:34:00 Delivery type: Vaginal, Spontaneous      Baby Feeding: Bottle and Breast Disposition:home with mother

## 2019-02-07 NOTE — H&P (Signed)
Stacey [name] is a 23 y.o. female G3P0020 @[redacted]w[redacted]d  by 7 week US, [facility] presenting for active labor and SROM.  She reports painful contractions every 4-5 minutes and gush of fluid x1 at home, second gush while in MAU.  Pregnancy complicated by mood disorder, [facility].     Nursing Staff Provider  [facility] Dating   7 week ultrasound, not consistent with LMP   Language  English Anatomy US   normal  Flu Vaccine   Genetic Screen  NIPS:   AFP:   First Screen:  Quad:  Quad based upon LMP normal, sent too early to be redated.    TDaP vaccine   11/25/18 Hgb A1C or  GTT 110   Rhogam   n/a   LAB RESULTS   Feeding Plan  Breast Blood Type O/Positive/-- (01/06 1107)   Contraception Nexplanon Antibody Negative (01/06 1107)  Circumcision  Yes- boy  Rubella 4.47 (01/06 1107)  [facility] RPR Non Reactive (01/06 1107)   Support Person FOB HBsAg Negative (01/06 1107)   Prenatal Classes  HIV Non Reactive (01/06 1107)  BTL Consent  GBS  (For PCN allergy, check sensitivities)   VBAC Consent  Pap  normal 04/02/2018    Hgb Electro   no sickle cell    CF     SMA     Waterbirth  [ ]  Class [ ]  Consent [ ]  CNM visit     OB History    Gravida  3   Para      Term      Preterm      AB  2   Living        SAB  1   TAB  1   Ectopic      Multiple      Live Births             Past Medical History:  Diagnosis Date  . ADHD   . ADHD (attention deficit hyperactivity disorder)   . Depression   . Mood disorder (HCC)   . ODD (oppositional defiant disorder)   . PTSD (post-traumatic stress disorder)   . PTSD (post-traumatic stress disorder)   . 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 NO PAST SURGERIES     Family History: family history includes ADD / ADHD in her brother, father, mother, and sister; Depression in her father and mother; Diabetes in her paternal  grandfather and paternal grandmother; Hyperlipidemia in her paternal grandfather. Social History:  reports that she has quit smoking. She has never used smokeless tobacco. She reports previous drug use. Drug: Marijuana. She reports that she does not drink alcohol.     Maternal Diabetes: No Genetic Screening: Normal Maternal Ultrasounds/Referrals: Normal Fetal Ultrasounds or other Referrals:  None Maternal Substance Abuse:  No Significant Maternal Medications:  None Significant Maternal Lab Results:  Group B Strep negative Other Comments:  None  Review of Systems  Constitutional: Negative for chills and fever.  Respiratory: Negative for shortness of breath.   Cardiovascular: Negative for chest pain.  Gastrointestinal: Positive for abdominal pain. Negative for constipation, diarrhea and vomiting.  Neurological: Negative for dizziness and headaches.  All other systems reviewed and are negative.  Maternal Medical History:  Reason for admission: Rupture of membranes and contractions.   Contractions: Onset was 3-5 hours ago.   Frequency: regular.   Perceived severity is  moderate.    Fetal activity: Perceived fetal activity is normal.   Last perceived fetal movement was within the past hour.    Prenatal complications: no prenatal complications Prenatal Complications - Diabetes: none.    Dilation: 3 Effacement (%): 100 Station: -2 Exam by:: A. Gagliardo, RN Blood pressure 116/74, pulse 85, temperature 97.8 F (36.6 C), temperature source Oral, last menstrual period 05/10/2018, SpO2 99 %. Maternal Exam:  Uterine Assessment: Contraction strength is moderate.  Contraction frequency is regular.   Abdomen: Patient reports no abdominal tenderness. Fetal presentation: vertex  Introitus: Ferning test: positive.  Amniotic fluid character: clear.     Fetal Exam Fetal Monitor Review: Mode: ultrasound.   Baseline rate: 135.  Variability: moderate (6-25 bpm).   Pattern:  accelerations present and no decelerations.    Fetal State Assessment: Category I - tracings are normal.     Physical Exam  Nursing note and vitals reviewed. Constitutional: She is oriented to person, place, and time. She appears well-developed and well-nourished.  Neck: Normal range of motion.  Cardiovascular: Normal rate, regular rhythm and normal heart sounds.  Respiratory: Effort normal and breath sounds normal.  GI: Soft.  Musculoskeletal: Normal range of motion.  Neurological: She is alert and oriented to person, place, and time.  Skin: Skin is warm and dry.  Psychiatric: She has a normal mood and affect. Her behavior is normal. Judgment and thought content normal.    Prenatal labs: ABO, Rh: O/Positive/-- (01/06 1107) Antibody: Negative (01/06 1107) Rubella: 4.47 (01/06 1107) RPR: Non Reactive (06/09 1459)  HBsAg: Negative (01/06 1107)  HIV: Non Reactive (06/09 1459)  GBS:   Negative  Assessment/Plan: [id number] at [redacted]w[redacted]d admitted for active labor with SROM GBS negative  Admit to L&D Expectant management May have epidural when desired Anticipate NSVD   Fatima Blank 02/07/2019, 11:18 AM

## 2019-02-07 NOTE — Anesthesia Preprocedure Evaluation (Signed)
Anesthesia Evaluation  Patient identified by MRN, date of birth, ID band Patient awake    Reviewed: Allergy & Precautions, H&P , NPO status , Patient's Chart, lab work & pertinent test results  Airway Mallampati: I  TM Distance: >3 FB Neck ROM: full    Dental no notable dental hx. (+) Teeth Intact   Pulmonary former smoker,    Pulmonary exam normal breath sounds clear to auscultation       Cardiovascular negative cardio ROS Normal cardiovascular exam Rhythm:regular Rate:Normal     Neuro/Psych PSYCHIATRIC DISORDERS Anxiety Depression negative neurological ROS     GI/Hepatic negative GI ROS, Neg liver ROS,   Endo/Other  negative endocrine ROS  Renal/GU negative Renal ROS     Musculoskeletal   Abdominal Normal abdominal exam  (+)   Peds  Hematology negative hematology ROS (+)   Anesthesia Other Findings   Reproductive/Obstetrics (+) Pregnancy                             Anesthesia Physical Anesthesia Plan  ASA: II  Anesthesia Plan: Epidural   Post-op Pain Management:    Induction:   PONV Risk Score and Plan:   Airway Management Planned:   Additional Equipment:   Intra-op Plan:   Post-operative Plan:   Informed Consent: I have reviewed the patients History and Physical, chart, labs and discussed the procedure including the risks, benefits and alternatives for the proposed anesthesia with the patient or authorized representative who has indicated his/her understanding and acceptance.       Plan Discussed with:   Anesthesia Plan Comments:         Anesthesia Quick Evaluation

## 2019-02-07 NOTE — Progress Notes (Signed)
Stacey [name] is a 23 y.o. G3P0020 at [redacted]w[redacted]d by 7 week ultrasound admitted for active labor, rupture of membranes  Subjective: Pt comfortable with epidural, feeling some lower abdominal pressure intermittently.  She desires FOB as support person but he is on house arrest and cannot leave currently.    Objective: BP 118/73   Pulse 78   Temp 98.1 F (36.7 C)   Resp 18   Ht 5\' 7"  (1.702 m)   Wt 74.8 kg   LMP 05/10/2018   SpO2 100%   BMI 25.84 kg/m  No intake/output data recorded. Total I/O In: -  Out: 3 [Urine:975]  FHT:  FHR: 135 bpm, variability: moderate,  accelerations:  Present,  decelerations:  Absent UC:   regular, every 2-3 minutes SVE:   Dilation: 7 Effacement (%): 100 Station: -1 Exam by:: L. Leftwich-Kirby AROM of forebag, clear fluid  Labs: Lab Results  Component Value Date   WBC 11.2 (H) 02/07/2019   HGB 12.4 02/07/2019   HCT 37.1 02/07/2019   MCV 92.1 02/07/2019   PLT 267 02/07/2019    Assessment / Plan: Spontaneous labor, progressing normally  Discussed with RN and with house coverage RN and offered pt option of another support person for now with the opportunity to switch one time (not multiple times) with FOB if he gets clearance to leave.  Pt considering.  Labor: Progressing normally Preeclampsia:  n/a Fetal Wellbeing:  Category I Pain Control:  Epidural I/D:  GBS neg Anticipated MOD:  NSVD  Fatima Blank 02/07/2019, 5:19 PM

## 2019-02-07 NOTE — MAU Note (Signed)
Pt states she started having contractions around 0630 this morning and they have gotten worse and stronger. She said she threw up earlier and is not sure if she urinated or if it was her water but not a lot since then. Denies VB, reports good fetal movement. Denies complications with the pregnancy.

## 2019-02-07 NOTE — Anesthesia Procedure Notes (Signed)
Epidural Patient location during procedure: OB Start time: 02/07/2019 12:55 PM End time: 02/07/2019 12:58 PM  Staffing Anesthesiologist: Lyn Hollingshead, MD Performed: anesthesiologist   Preanesthetic Checklist Completed: patient identified, site marked, surgical consent, pre-op evaluation, timeout performed, IV checked, risks and benefits discussed and monitors and equipment checked  Epidural Patient position: sitting Prep: site prepped and draped and DuraPrep Patient monitoring: continuous pulse ox and blood pressure Approach: midline Location: L3-L4 Injection technique: LOR air  Needle:  Needle type: Tuohy  Needle gauge: 17 G Needle length: 9 cm and 9 Needle insertion depth: 6 cm Catheter type: closed end flexible Catheter size: 19 Gauge Catheter at skin depth: 11 cm Test dose: negative and Other  Assessment Events: blood not aspirated, injection not painful, no injection resistance, negative IV test and no paresthesia  Additional Notes Reason for block:procedure for pain

## 2019-02-08 LAB — CBC
HCT: 31.5 % — ABNORMAL LOW (ref 36.0–46.0)
Hemoglobin: 10.4 g/dL — ABNORMAL LOW (ref 12.0–15.0)
MCH: 30.4 pg (ref 26.0–34.0)
MCHC: 33 g/dL (ref 30.0–36.0)
MCV: 92.1 fL (ref 80.0–100.0)
Platelets: 211 10*3/uL (ref 150–400)
RBC: 3.42 MIL/uL — ABNORMAL LOW (ref 3.87–5.11)
RDW: 13.8 % (ref 11.5–15.5)
WBC: 12.8 10*3/uL — ABNORMAL HIGH (ref 4.0–10.5)
nRBC: 0 % (ref 0.0–0.2)

## 2019-02-08 LAB — RPR: RPR Ser Ql: NONREACTIVE

## 2019-02-08 NOTE — Anesthesia Postprocedure Evaluation (Signed)
Anesthesia Post Note  Patient: Stacey [name]  Procedure(s) Performed: AN AD HOC LABOR EPIDURAL     Patient location during evaluation: Mother Baby Anesthesia Type: Epidural Level of consciousness: awake and alert and oriented Pain management: satisfactory to patient Vital Signs Assessment: post-procedure vital signs reviewed and stable Respiratory status: respiratory function stable Cardiovascular status: stable Postop Assessment: no headache, no backache, epidural receding, patient able to bend at knees, no signs of nausea or vomiting and adequate PO intake Anesthetic complications: no    Last Vitals:  Vitals:   02/07/19 2356 02/08/19 0500  BP: 113/75 (!) 90/56  Pulse: 81 65  Resp: 16 17  Temp: 37.3 C 36.7 C  SpO2:  98%    Last Pain:  Vitals:   02/08/19 0527  TempSrc:   PainSc: 4    Pain Goal:                   Lenwood Balsam

## 2019-02-08 NOTE — Progress Notes (Signed)
POSTPARTUM PROGRESS NOTE  Post Partum Day 1  Subjective:  Stacey [name] is a 23 y.o. H5K5625 s/p NSVD at [redacted]w[redacted]d.  She reports she is doing well. No acute events overnight. She denies any problems with ambulating, voiding or po intake. Denies nausea or vomiting.  Pain is well controlled.  Lochia is diminishing.  Objective: Blood pressure 110/69, pulse 95, temperature 98.1 F (36.7 C), temperature source Oral, resp. rate 16, height 5\' 7"  (1.702 m), weight 74.8 kg, last menstrual period 05/10/2018, SpO2 100 %, unknown if currently breastfeeding.  Physical Exam:  General: alert, cooperative and no distress Chest: no respiratory distress Heart:regular rate, distal pulses intact Abdomen: soft, nontender,  Uterine Fundus: firm, appropriately tender DVT Evaluation: No calf swelling or tenderness Extremities: no edema Skin: warm, dry  Recent Labs    02/07/19 1138 02/08/19 0407  HGB 12.4 10.4*  HCT 37.1 31.5*    Assessment/Plan: Stacey [name] is a 23 y.o. W3S9373 s/p NSVD at [redacted]w[redacted]d   PPD#1 - Doing well  Routine postpartum care Contraception: nexplanon pending Feeding: both Dispo: Plan for discharge PPD#2.   LOS: 1 day   Clarnce Flock MD/[facility] OB Fellow  02/08/2019, 12:40 PM

## 2019-02-08 NOTE — Lactation Note (Signed)
This note was copied from a baby's chart. Lactation Consultation Note  Patient Name: Boy Pasty Manninen JYNWG'N Date: 02/08/2019 Reason for consult: Initial assessment;1st time breastfeeding;Early term 37-38.6wks P1, 10 hour female infant. Infant had one void and one stool since birth.  Mom's feeding choice at admission was breast and formula feeding. Per mom, infant latches well she breastfeed him for 15 to 30 minutes and breastfeed infant only twice she feels she doesn't have milk so she has given infant formula 2 times  first  30 ml and then 20 ml per feeding. LC discussed infant small tummy size, mom will decrease formula amount and offer formula after she latches infant to breast according infant age/ hours of life. LC unable observe a feeding at this time. LC ask mom teach back hand expression mom was surprised that she has breast milk. Grandmother is encouraging mom to breastfeed mom has 62 month old sibling that grandmother is breastfeeding. Mom knows to breastfeed infant according hunger cues, 8 to 12 times with 24 hours and on demand. Mom knows to call Nurse or [place] if she has any questions, concerns or need assistance with latching infant to breast/  Per mom she used pumped twice LC encouraged to pump after breastfeeding infant for breast stimulation and induction. Mom started using DEBP and has colostrum present in both bottles  and mom will offer breast milk at next feeding after latching infant to breast. Mom made aware of O/P services, breastfeeding support groups, community resources, and our phone # for post-discharge questions.  Mom 's plan: 1. Latch infant to breast, then supplement infant according age/ hours of life with EBM/ and or formula. 2. Mom will use DEBP every 3 hours for 15 minutes on initial setting. 3. Mom will continue to do as much STS as possible.    Maternal Data Formula Feeding for Exclusion: Yes Reason for exclusion: Mother's choice to formula and  breast feed on admission Has patient been taught Hand Expression?: Yes Does the patient have breastfeeding experience prior to this delivery?: No  Feeding Feeding Type: Bottle Fed - Formula  LATCH Score                   Interventions Interventions: Breast feeding basics reviewed;Skin to skin;Position options;Hand pump;DEBP;Hand express  Lactation Tools Discussed/Used WIC Program: Yes Pump Review: Setup, frequency, and cleaning;Milk Storage Initiated by:: Nurse Date initiated:: 02/07/19   Consult Status Consult Status: Follow-up Date: 02/08/19 Follow-up type: In-patient    Vicente Serene 02/08/2019, 7:16 AM

## 2019-02-09 DIAGNOSIS — Z30017 Encounter for initial prescription of implantable subdermal contraceptive: Secondary | ICD-10-CM

## 2019-02-09 MED ORDER — LIDOCAINE HCL 1 % IJ SOLN
0.0000 mL | Freq: Once | INTRAMUSCULAR | Status: AC | PRN
  Administered 2019-02-09: 20 mL via INTRADERMAL
  Filled 2019-02-09: qty 20

## 2019-02-09 MED ORDER — IBUPROFEN 600 MG PO TABS: 600.0000 mg | ORAL_TABLET | Freq: Four times a day (QID) | ORAL | 0 refills | Status: DC

## 2019-02-09 MED ORDER — ETONOGESTREL 68 MG ~~LOC~~ IMPL
68.0000 mg | DRUG_IMPLANT | Freq: Once | SUBCUTANEOUS | Status: AC
  Administered 2019-02-09: 68 mg via SUBCUTANEOUS
  Filled 2019-02-09: qty 1

## 2019-02-09 NOTE — Progress Notes (Signed)
Post-Placental Nexplanon Insertion Procedure Note  Patient was identified. Informed consent was signed, signed copy in chart. A time-out was performed.    The insertion site was identified 8-10 cm (3-4 inches) from the medial epicondyle of the humerus and 3-5 cm (1.25-2 inches) posterior to (below) the sulcus (groove) between the biceps and triceps muscles of the patient's left arm and marked. The site was prepped and draped in the usual sterile fashion. Pt was prepped with alcohol swab and then injected with 3 cc of 1% lidocaine. The site was prepped with betadine. Nexplanon removed form packaging,  Device confirmed in needle, then inserted full length of needle and withdrawn per handbook instructions. Provider and patient verified presence of the implant in the woman's arm by palpation. Pt insertion site was covered with steristrips/adhesive bandage and pressure bandage. There was minimal blood loss. Patient tolerated procedure well.  Patient was given post procedure instructions and Nexplanon user card with expiration date. Condoms were recommended for STI prevention. Patient was asked to keep the pressure dressing on for 24 hours to minimize bruising and keep the adhesive bandage on for 3-5 days. The patient verbalized understanding of the plan of care and agrees.   Lot # [id number] Expiration Date11/02/2021  

## 2019-02-09 NOTE — Progress Notes (Signed)
CSW has made [place] and [facility] Referral for infant.        Virgie Dad Chaniyah Jahr, MSW, LCSW [facility] [place] [phone]

## 2019-02-09 NOTE — Clinical Social Work Maternal (Signed)
CLINICAL SOCIAL WORK MATERNAL/CHILD NOTE  Patient Details  Name: Stacey [name] MRN: [id number] Date of Birth: 1995/11/10  Date:  02/09/2019  Clinical Social Worker Initiating Note:  Hortencia PilarKierra Shahin Knierim, LCSW Date/Time: Initiated:  02/09/19/1045     Child's Name:  Stacey [name]   Biological Parents:  Mother, Father   Need for Interpreter:  None   Reason for Referral:  Other (Comment)(hx of Suicide attempt, PTSD, and Mood Disorder.)   Address:  [address] [place] [address]    Phone number:  [phone] (home)     Additional phone number: none   Household Members/Support Persons (HM/SP):       HM/SP Name Relationship DOB or Age  HM/SP -1  Stacey [name]  grandmother   [id number]  HM/SP -2  Stacey [name]   MOB   21  HM/SP -3  Stacey [name]   brother   314  HM/SP -4  Stacey [name]   sister   3  HM/SP -5  Stacey [name]  sister   [id number]  HM/SP -6  Stacey [name]   brother   4  HM/SP -7        HM/SP -8          Natural Supports (not living in the home):  Extended Family, Immediate Family   Professional Supports: ParamedicTherapist, Organized support group (Comment)   Employment: Unemployed   Type of Work: none   Education:  High school graduate   Homebound arranged:  n/a  Surveyor, quantityinancial Resources:  Medicaid   Other Resources[facility]   Cultural/Religious Considerations Which May Impact Care:  none reported.   Strengths:  Home prepared for child , Compliance with medical plan , Ability to meet basic needs    Psychotropic Medications:    none reported.      P[facility]ian:     [facility]   [place] List:   [facility]    [place]    [place]    [place]    [place]    [place]      Pediatrician Fax Number:    Risk Factors/Current Problems:  None   Cognitive State:  Alert , Able to Concentrate , Insightful , Goal Oriented    Mood/Affect:  Happy , Interested , Calm , Comfortable , Relaxed     CSW Assessment: CSW consulted as MOB has a history suicide attempt, PTSD, Mood Disorder, and ODD. CSW went to speak with MOB at bedside to address further needs.   Upon entering the room CSW congratulated MOB on the birth of infant. Stacey [name](Stacey [name]). CSW advised MOB of the reason for CSW coming to speak with her. MOB reported that it was okay for CSW to speak with her and ask her questions .CSW began assessment by asking MOB about her mental health diagnosis. MOB reports that she has had a pretty tough life most of her life. MOB reports that she was diagnosed with a lot of her mental health diagnosis as a child and was paced on medications and in therapy as she grew older. MOB reports that since stopping her medications she has been "doing okay". MOB expressed that she attempted suicide in 205 but didn't wish to go into details that led up to her suicide attempt. CSW asked MOB how she has been feeling regarding SI and HI. MOB informed CSW that she is not suicidal or homicidal;. MOB reports that she is connected to resources in the community such as [facility], [facility], and a therapist in which she used to meet with in the past Stacey [name]). MOB reports that she hasn't need her therapist recently but understand and knows where she can go to get resources if needed. MOB reports that she has been coping well. Coping skills that MOB identified before pregnancy were THC use and th use of edibles. MOB reports that once she confirmed that sh e was pregnant she stopped all use of cigarettes, drinking and THC use.   CSW inquired from [facility] about this pregnancy as CSW noted in chart review that MOB was initially not interested in keeping infant. MOB reports that she and child father were going through some things when she found out she was pregnant. MOB reports that this pregnancy was not intended and that she felt "trapped" as she feels that FOB may have been aiming to get her pregnant. CSW inquired from  MOB if she was forced to have sex and MOB reported that everything was consensual she just wasn't wanting to or expecting to get pregnant. MOB reassured CSW that she loved infant and that she couldn't have an abortion as she and FOB both come from religious back grounds that wont allow her to do that. CSW understanding and offered MOB further resources and supports that may aid in helping her care for infant as needed.   MOB reports that she is from home with her grandmother and siblings. MOB reports that she is wanting to leave today so that she can go to her [facility] appointment to get [facility] for infant. CSW sought details on if MOB had financial means to get milk for infant and MOB reports "I don't but I want to get the [facility] if possible that way I can have money to buy him other things like if he gets sick or even pampers". CSW understanding and offered even further resources to MOB to help as needed. CSW provided MOB With baby box as MOB reports that she didn't have a space just yet. MOB was given Baby Bundle as well as MOB reports that all the clothes she bought for infant are to big for infant at this time. CSW ensured that MOB had all items that could be given by staff at the hospital. CSW advised MOB that CSW would make [facility] referral for her to get further supports and services as needed. MOB agreeable.   CSW provided PPD and SIDS education to MOB. MOB was given PPD Checklist to keep track of feelings related to PPD. MOB reports no further needs at this time and suggested that she and FOB are working on communication to better care for infant.   CSW Plan/Description:  No Further Intervention Required/No Barriers to Discharge, Sudden Infant Death Syndrome (SIDS) Education, Perinatal Mood and Anxiety Disorder (PMADs) Education    Wetzel Bjornstad, [place] 02/09/2019, 11:24 AM

## 2019-02-09 NOTE — Lactation Note (Signed)
This note was copied from a baby's chart. Lactation Consultation Note  Patient Name: Boy Patsy Zaragoza [id number]'C Date: 02/09/2019 Reason for consult: Follow-up assessment   LC Follow Up Visit:  Attempted to visit with mother, however, she was asleep.  Will return later today.                 Consult Status Consult Status: Follow-up Date: 02/09/19 Follow-up type: In-patient    Aryah Doering R Gabor Lusk 02/09/2019, 9:24 AM

## 2019-02-13 ENCOUNTER — Ambulatory Visit: Payer: Medicaid Other | Admitting: Family Medicine

## 2019-03-12 ENCOUNTER — Other Ambulatory Visit: Payer: Self-pay

## 2019-03-12 ENCOUNTER — Encounter: Payer: Self-pay | Admitting: Family Medicine

## 2019-03-12 ENCOUNTER — Ambulatory Visit (INDEPENDENT_AMBULATORY_CARE_PROVIDER_SITE_OTHER): Payer: Medicaid Other | Admitting: Family Medicine

## 2019-03-12 VITALS — BP 100/62 | HR 103 | Ht 67.0 in | Wt 148.0 lb

## 2019-03-12 DIAGNOSIS — F53 Postpartum depression: Secondary | ICD-10-CM

## 2019-03-12 DIAGNOSIS — O99345 Other mental disorders complicating the puerperium: Secondary | ICD-10-CM

## 2019-03-12 NOTE — Progress Notes (Signed)
     Subjective: Chief Complaint  Patient presents with  . Postpartum Care     HPI: Stacey [name] is a 23 y.o. presenting to clinic today to discuss the following:  Overall, doing "okay, no different than last time I was here". Feels depressed but feels like she is coping well. Doesn't feel overwhelmed with the new baby and states she has good family support at home. Does not have any thoughts or plans of ending her life, hurting herself, or hurting others. Is interested in going to therapy.   Her Edinburgh PDS score below: 1) as much as I always could 2) rather less than I used to 3) not very often 4) hardly ever 5) yes, sometimes 6) no, most of the time I have coped quite well 7) not very often 8) not very often 9) only occasionally 10) never  Total score of 9 which is below the threshold of 10 indicated to start medication. Regardless, patient refuses medication at this time as she does not wish to "pollute" her body.   ROS noted in HPI.    Social History   Tobacco Use  Smoking Status Former Smoker  Smokeless Tobacco Never Used    Objective: BP 100/62   Pulse (!) 103   Ht 5\' 7"  (1.702 m)   Wt 148 lb (67.1 kg)   LMP 05/10/2018   SpO2 99%   BMI 23.18 kg/m  Vitals and nursing notes reviewed  Physical Exam Gen: Alert and Oriented x 3, NAD CV: RRR, no murmurs, normal S1, S2 split Resp: CTAB, no wheezing, rales, or rhonchi, comfortable work of breathing Ext: no clubbing, cyanosis, or edema Skin: warm, dry, intact, no rashes  Assessment/Plan:  Post partum depression Patient reports no SI or HI. She wished to speak to Neoma Laming today but did not wish to wait for me to go see if she was present in the office today. She states she does not wish nor feel the need to take anti-depressant medications at this time but is interested in therpay. - Referral to Psychology   PATIENT EDUCATION PROVIDED: See AVS    Diagnosis and plan along with any newly  prescribed medication(s) were discussed in detail with this patient today. The patient verbalized understanding and agreed with the plan. Patient advised if symptoms worsen return to clinic or ER.    Orders Placed This Encounter  Procedures  . Ambulatory referral to Psychology    Referral Priority:   Routine    Referral Type:   Psychiatric    Referral Reason:   Specialty Services Required    Requested Specialty:   Psychology    Number of Visits Requested:   1    No orders of the defined types were placed in this encounter.    Harolyn Rutherford, DO 03/12/2019, 10:38 AM PGY-3 [place]

## 2019-03-17 ENCOUNTER — Telehealth: Payer: Self-pay | Admitting: Licensed Clinical Social Worker

## 2019-03-17 NOTE — Telephone Encounter (Signed)
   Unsuccessful Phone Outreach Note  03/17/2019 Name: Stacey [name] MRN: [id number] DOB: December 15, 1995  Referred by:  Dr. Garlan Fillers  Reason for referral : Care Coordination and Depression   [name] is a 23 y.o. year old female who sees [name], MD for primary care.  LCSW received referral to assist patient connecting to mental health provider due to postpartum needs .  Called patient to assess needs and barriers reference the above referral. Briefly spoke to patient but unable to assess needs to call patient back.  Called back within 3 min.  Patient did not answer the phone. Telephone outreach was unsuccessful. A HIPPA compliant phone message was left for the patient providing contact information and requesting a return call.  Plan: Left voice message to call LCSW.Casimer Lanius, LCSW Clinical Social Worker [place] / [place]   [phone] 11:07 AM

## 2019-03-18 NOTE — Patient Instructions (Signed)

## 2019-03-18 NOTE — Assessment & Plan Note (Signed)
Patient reports no SI or HI. She wished to speak to Neoma Laming today but did not wish to wait for me to go see if she was present in the office today. She states she does not wish nor feel the need to take anti-depressant medications at this time but is interested in therpay. - Referral to Psychology

## 2019-03-24 NOTE — Telephone Encounter (Signed)
   Care Coordination Follow up Phone Note Social Work   03/24/2019 Name: Stacey [name] MRN: [id number] DOB: [birth year]  Stacey [name] is a 23 y.o. year old female who sees [name], MD for primary care.  Reason for follow-up: phone encounter with patient today to see if she connected with counseling services or needed assistance with coordination. Patient reports:Patient answered the phone and asked LCSW if she could call back Assessment: unable to assess needs. Plan: No further follow up required: by LCSW. Will wait for patient to return call   Casimer Lanius, [place] / [place]   [phone] 10:29 AM

## 2019-05-06 ENCOUNTER — Other Ambulatory Visit: Payer: Self-pay

## 2019-05-06 ENCOUNTER — Ambulatory Visit (HOSPITAL_COMMUNITY)
Admission: EM | Admit: 2019-05-06 | Discharge: 2019-05-06 | Disposition: A | Discharge status: Home / Self Care | Payer: Medicaid Other | Attending: Family Medicine | Admitting: Family Medicine

## 2019-05-06 ENCOUNTER — Encounter (HOSPITAL_COMMUNITY): Payer: Self-pay | Admitting: Emergency Medicine

## 2019-05-06 DIAGNOSIS — N898 Other specified noninflammatory disorders of vagina: Secondary | ICD-10-CM | POA: Diagnosis not present

## 2019-05-06 DIAGNOSIS — Z202 Contact with and (suspected) exposure to infections with a predominantly sexual mode of transmission: Secondary | ICD-10-CM | POA: Diagnosis not present

## 2019-05-06 MED ORDER — AZITHROMYCIN 250 MG PO TABS
1000.0000 mg | ORAL_TABLET | Freq: Once | ORAL | Status: AC
  Administered 2019-05-06: 1000 mg via ORAL

## 2019-05-06 MED ORDER — AZITHROMYCIN 250 MG PO TABS
ORAL_TABLET | ORAL | Status: AC
  Filled 2019-05-06: qty 4

## 2019-05-06 MED ORDER — CEFTRIAXONE SODIUM 250 MG IJ SOLR
INTRAMUSCULAR | Status: AC
  Filled 2019-05-06: qty 250

## 2019-05-06 MED ORDER — FLUCONAZOLE 150 MG PO TABS: 150.0000 mg | ORAL_TABLET | Freq: Once | ORAL | 0 refills | Status: AC

## 2019-05-06 MED ORDER — CEFTRIAXONE SODIUM 250 MG IJ SOLR
250.0000 mg | Freq: Once | INTRAMUSCULAR | Status: AC
  Administered 2019-05-06: 17:00:00 250 mg via INTRAMUSCULAR

## 2019-05-06 NOTE — Discharge Instructions (Signed)
We have treated you today for gonorrhea and chlamydia, with rocephin and azithromycin. Please refrain from sexual intercourse for 7 days while medicines eliminating infection.   We are testing you for Gonorrhea, Chlamydia, Trichomonas, Yeast and Bacterial Vaginosis. We will call you if anything is positive and let you know if you require any further treatment. Please inform partners of any positive results.   Please return if symptoms not improving with treatment, development of fever, nausea, vomiting, abdominal pain. ' 

## 2019-05-06 NOTE — ED Provider Notes (Signed)
[facility]    CSN: [id number] Arrival date & time: 05/06/19  1504      History   Chief Complaint Chief Complaint  Patient presents with  . SEXUALLY TRANSMITTED DISEASE    HPI Stacey [name] is a 23 y.o. female history of recent pregnancy, approximately 2 months postpartum presenting today for evaluation of STD screening and vaginal discharge.  Patient states that she has developed a yellow discharge with slight itching.  States that she did recently have unprotected course with a previous partner and is unsure of he may have been sleeping with others as well.  She denies any fevers, nausea vomiting or abdominal pain.  Denies any rashes or lesions.  Denies urinary symptoms of dysuria, increased frequency or hematuria.  Has Nexplanon in place, placed after pregnancy.  Has not returned to her regular consistent cycles since.  HPI  Past Medical History:  Diagnosis Date  . ADHD   . ADHD (attention deficit hyperactivity disorder)   . Depression   . Mood disorder (HCC)   . ODD (oppositional defiant disorder)   . PTSD (post-traumatic stress disorder)   . PTSD (post-traumatic stress disorder)   . 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 Normal labor 02/07/2019  . Low weight gain in pregnancy  01/01/2019  . Second trimester bleeding 11/11/2018  . Prenatal care in second trimester 08/28/2018  . Supervision of low-risk pregnancy, first trimester 07/02/2018  . Seasonal allergic rhinitis 03/19/2018  . Post partum depression 01/08/2014  . Other constipation 06/26/2013  . Mood disorder (HCC) 06/24/2012    Past Surgical History:  Procedure Laterality Date  . NO PAST SURGERIES      OB History    Gravida  3   Para  1   Term  1   Preterm      AB  2   Living  1     SAB  1   TAB  1   Ectopic      Multiple  0   Live Births  1            Home Medications    Prior to  Admission medications   Medication Sig Start Date End Date Taking? Authorizing Provider  fluconazole (DIFLUCAN) 150 MG tablet Take 1 tablet (150 mg total) by mouth once for 1 dose. 05/06/19 05/06/19  Murle Otting C, PA-C  ibuprofen (ADVIL) 600 MG tablet Take 1 tablet (600 mg total) by mouth every 6 (six) hours. 02/09/19   Leftwich-Kirby, Wilmer FloorLisa A, CNM  polyethylene glycol powder (GLYCOLAX/MIRALAX) 17 GM/SCOOP powder Take 17 g by mouth daily. 11/11/18   Tillman Sersiccio, Angela C, DO  Prenatal Multivit-Min-Fe-FA (PRENATAL/IRON) TABS Take 1 tablet by mouth every morning. 11/26/18   Garnette Gunnerhompson, Aaron B, MD  Prenatal Vit-Fe Fumarate-FA (PNV PRENATAL PLUS MULTIVITAMIN) 27-1 MG TABS Take 1 Dose by mouth daily. 10/07/18   Myrene BuddyFletcher, Jacob, MD    Family History Family History  Problem Relation Age of Onset  . Depression Mother        bipolar, ptsd  . ADD / ADHD Mother   . Depression Father        depression,ptsd  . ADD / ADHD Father   . ADD / ADHD Sister   . ADD / ADHD Brother   . Diabetes Paternal Grandmother   . Diabetes Paternal Grandfather   . Hyperlipidemia Paternal Grandfather     Social History Social  History   Tobacco Use  . Smoking status: Former Games developer  . Smokeless tobacco: Never Used  Substance Use Topics  . Alcohol use: No    Alcohol/week: 0.0 standard drinks  . Drug use: Not Currently    Types: Marijuana    Comment: stopped @ 90months of pregnancy     Allergies   Penicillins   Review of Systems Review of Systems  Constitutional: Negative for fever.  Respiratory: Negative for shortness of breath.   Cardiovascular: Negative for chest pain.  Gastrointestinal: Negative for abdominal pain, diarrhea, nausea and vomiting.  Genitourinary: Positive for vaginal discharge. Negative for dysuria, flank pain, genital sores, hematuria, menstrual problem, vaginal bleeding and vaginal pain.  Musculoskeletal: Negative for back pain.  Skin: Negative for rash.  Neurological: Negative for  dizziness, light-headedness and headaches.     Physical Exam Triage Vital Signs ED Triage Vitals [05/06/19 1652]  Enc Vitals Group     BP (!) 119/95     Pulse Rate 89     Resp 16     Temp 98.8 F (37.1 C)     Temp Source Oral     SpO2 100 %     Weight      Height      Head Circumference      Peak Flow      Pain Score      Pain Loc      Pain Edu?      Excl. in GC?    No data found.  Updated Vital Signs BP (!) 119/95 (BP Location: Left Arm)   Pulse 89   Temp 98.8 F (37.1 C) (Oral)   Resp 16   SpO2 100%   Visual Acuity Right Eye Distance:   Left Eye Distance:   Bilateral Distance:    Right Eye Near:   Left Eye Near:    Bilateral Near:     Physical Exam Vitals signs and nursing note reviewed.  Constitutional:      Appearance: She is well-developed.     Comments: No acute distress  HENT:     Head: Normocephalic and atraumatic.     Nose: Nose normal.  Eyes:     Conjunctiva/sclera: Conjunctivae normal.  Neck:     Musculoskeletal: Neck supple.  Cardiovascular:     Rate and Rhythm: Normal rate.  Pulmonary:     Effort: Pulmonary effort is normal. No respiratory distress.  Abdominal:     General: There is no distension.     Comments: Soft, nondistended, nontender to light and deep palpation throughout abdomen  Musculoskeletal: Normal range of motion.  Skin:    General: Skin is warm and dry.  Neurological:     Mental Status: She is alert and oriented to person, place, and time.      UC Treatments / Results  Labs (all labs ordered are listed, but only abnormal results are displayed) Labs Reviewed  CERVICOVAGINAL ANCILLARY ONLY    EKG   Radiology No results found.  Procedures Procedures (including critical care time)  Medications Ordered in UC Medications  cefTRIAXone (ROCEPHIN) injection 250 mg (has no administration in time range)  azithromycin (ZITHROMAX) tablet 1,000 mg (has no administration in time range)    Initial Impression /  Assessment and Plan / UC Course  I have reviewed the triage vital signs and the nursing notes.  Pertinent labs & imaging results that were available during my care of the patient were reviewed by me and considered in my medical decision  making (see chart for details).     Vaginal swab obtained, will check for STDs as well as yeast and BV.  Empirically treating for gonorrhea and chlamydia given description of discharge as well as possible exposure to STD.  Diflucan sent to treat.  Yeast.  Will call with results and provide further treatment as needed.Discussed strict return precautions. Patient verbalized understanding and is agreeable with plan.  Final Clinical Impressions(s) / UC Diagnoses   Final diagnoses:  Vaginal discharge  Possible exposure to STD     Discharge Instructions     We have treated you today for gonorrhea and chlamydia, with rocephin and azithromycin. Please refrain from sexual intercourse for 7 days while medicines eliminating infection.   We are testing you for Gonorrhea, Chlamydia, Trichomonas, Yeast and Bacterial Vaginosis. We will call you if anything is positive and let you know if you require any further treatment. Please inform partners of any positive results.   Please return if symptoms not improving with treatment, development of fever, nausea, vomiting, abdominal pain.     ED Prescriptions    Medication Sig Dispense Auth. Provider   fluconazole (DIFLUCAN) 150 MG tablet Take 1 tablet (150 mg total) by mouth once for 1 dose. 2 tablet Cova Knieriem, Gerlach C, PA-C     PDMP not reviewed this encounter.   Janith Lima, PA-C 05/06/19 1702

## 2019-05-06 NOTE — ED Triage Notes (Addendum)
Pt states she was here for STD Treatment.

## 2019-05-06 NOTE — ED Notes (Signed)
Patient requesting covid testing.  Notified provider

## 2019-05-07 ENCOUNTER — Ambulatory Visit: Payer: Medicaid Other

## 2019-05-08 ENCOUNTER — Telehealth: Payer: Self-pay | Admitting: Emergency Medicine

## 2019-05-08 LAB — CERVICOVAGINAL ANCILLARY ONLY
Bacterial vaginitis: POSITIVE — AB
Candida vaginitis: POSITIVE — AB
Chlamydia: NEGATIVE
Neisseria Gonorrhea: NEGATIVE
Trichomonas: NEGATIVE

## 2019-05-08 MED ORDER — METRONIDAZOLE 500 MG PO TABS: 500.0000 mg | ORAL_TABLET | Freq: Two times a day (BID) | ORAL | 0 refills | Status: AC

## 2019-05-08 NOTE — Telephone Encounter (Signed)
Bacterial vaginosis is positive. This was not treated at the urgent care visit.  Flagyl 500 mg BID x 7 days #14 no refills sent to patients pharmacy of choice.    Candida (yeast) is positive.  Prescription for fluconazole was given at the urgent care visit.    Patient contacted and made aware of    results. Pt verbalized understanding and had all questions answered.    

## 2020-02-29 ENCOUNTER — Encounter (HOSPITAL_COMMUNITY): Payer: Self-pay

## 2020-02-29 ENCOUNTER — Other Ambulatory Visit: Payer: Self-pay

## 2020-02-29 ENCOUNTER — Ambulatory Visit (HOSPITAL_COMMUNITY)
Admission: EM | Admit: 2020-02-29 | Discharge: 2020-02-29 | Disposition: A | Discharge status: Home / Self Care | Payer: Medicaid Other | Attending: Physician Assistant | Admitting: Physician Assistant

## 2020-02-29 DIAGNOSIS — J029 Acute pharyngitis, unspecified: Secondary | ICD-10-CM | POA: Diagnosis not present

## 2020-02-29 DIAGNOSIS — R519 Headache, unspecified: Secondary | ICD-10-CM

## 2020-02-29 DIAGNOSIS — R110 Nausea: Secondary | ICD-10-CM | POA: Insufficient documentation

## 2020-02-29 DIAGNOSIS — Z1152 Encounter for screening for COVID-19: Secondary | ICD-10-CM | POA: Insufficient documentation

## 2020-02-29 MED ORDER — KETOROLAC TROMETHAMINE 30 MG/ML IJ SOLN
INTRAMUSCULAR | Status: AC
  Filled 2020-02-29: qty 1

## 2020-02-29 MED ORDER — KETOROLAC TROMETHAMINE 30 MG/ML IJ SOLN
30.0000 mg | Freq: Once | INTRAMUSCULAR | Status: AC
  Administered 2020-02-29: 30 mg via INTRAVENOUS

## 2020-02-29 NOTE — Discharge Instructions (Signed)
Follow up with primary care physician as planned.   May check MyChart for COVID test results.

## 2020-02-29 NOTE — ED Triage Notes (Signed)
Pt presents with sore throat from post nasal drip for past few days.  Pt has chronic diarrhea, nausea, and vomiting since having her child.

## 2020-02-29 NOTE — ED Notes (Signed)
Pt had negative covid 2 days ago.

## 2020-02-29 NOTE — ED Provider Notes (Signed)
[facility]    CSN: [id number] Arrival date & time: 02/29/20  1218      History   Chief Complaint Chief Complaint  Patient presents with  . Diarrhea  . Sore Throat  . Nausea  . Vomiting    HPI Stacey [name] is a 24 y.o. female.   Pt presents with nasal congestion and sore throat that started several days ago.  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  She also complains of diarrhea, nausea, and vomiting that is chronic.  She has been experiencing these sx intermittently since the birth of her last child.  Reports she has an upcoming appointment for these sx.  Upon recheck pt complains of headache and request something for headache.  Reports she gets frequent migraines. She is currently taking nothing for the sx.  She denies fever, chills, cough, shortness of breath, photophobia.      Past Medical History:  Diagnosis Date  . ADHD   . ADHD (attention deficit hyperactivity disorder)   . Depression   . Mood disorder (HCC)   . ODD (oppositional defiant disorder)   . PTSD (post-traumatic stress disorder)   . PTSD (post-traumatic stress disorder)   . 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 Normal labor 02/07/2019  . Low weight gain in pregnancy  01/01/2019  . Second trimester bleeding 11/11/2018  . Prenatal care in second trimester 08/28/2018  . Supervision of low-risk pregnancy, first trimester 07/02/2018  . Seasonal allergic rhinitis 03/19/2018  . Post partum depression 01/08/2014  . Other constipation 06/26/2013  . Mood disorder (HCC) 06/24/2012    Past Surgical History:  Procedure Laterality Date  . NO PAST SURGERIES      OB History    Gravida  3   Para  1   Term  1   Preterm      AB  2   Living  1     SAB  1   TAB  1   Ectopic      Multiple  0   Live Births  1            Home Medications    Prior to  Admission medications   Medication Sig Start Date End Date Taking? Authorizing Provider  ibuprofen (ADVIL) 600 MG tablet Take 1 tablet (600 mg total) by mouth every 6 (six) hours. 02/09/19   Leftwich-Kirby, Wilmer Floor, CNM  polyethylene glycol powder (GLYCOLAX/MIRALAX) 17 GM/SCOOP powder Take 17 g by mouth daily. 11/11/18   Tillman Sers, DO  Prenatal Multivit-Min-Fe-FA (PRENATAL/IRON) TABS Take 1 tablet by mouth every morning. 11/26/18   Garnette Gunner, MD  Prenatal Vit-Fe Fumarate-FA (PNV PRENATAL PLUS MULTIVITAMIN) 27-1 MG TABS Take 1 Dose by mouth daily. 10/07/18   Myrene Buddy, MD    Family History Family History  Problem Relation Age of Onset  . Depression Mother        bipolar, ptsd  . ADD / ADHD Mother   . Depression Father        depression,ptsd  . ADD / ADHD Father   . ADD / ADHD Sister   . ADD / ADHD Brother   . Diabetes Paternal Grandmother   . Diabetes Paternal Grandfather   . Hyperlipidemia Paternal Grandfather     Social History Social History   Tobacco Use  . Smoking status: Former Games developer  .  Smokeless tobacco: Never Used  Vaping Use  . Vaping Use: Never used  Substance Use Topics  . Alcohol use: No    Alcohol/week: 0.0 standard drinks  . Drug use: Not Currently    Types: Marijuana    Comment: stopped @ 59months of pregnancy     Allergies   Penicillins   Review of Systems Review of Systems  Constitutional: Negative for chills and fever.  HENT: Positive for congestion and sore throat. Negative for ear pain, sinus pressure and sinus pain.   Eyes: Negative for pain and visual disturbance.  Respiratory: Negative for cough and shortness of breath.   Cardiovascular: Negative for chest pain and palpitations.  Gastrointestinal: Positive for nausea. Negative for abdominal pain and vomiting.  Genitourinary: Negative for dysuria and hematuria.  Musculoskeletal: Negative for arthralgias and back pain.  Skin: Negative for color change and rash.  Neurological:  Negative for seizures and syncope.  All other systems reviewed and are negative.    Physical Exam Triage Vital Signs ED Triage Vitals [02/29/20 1622]  Enc Vitals Group     BP      Pulse      Resp      Temp      Temp src      SpO2      Weight      Height      Head Circumference      Peak Flow      Pain Score 5     Pain Loc      Pain Edu?      Excl. in GC?    No data found.  Updated Vital Signs LMP 02/12/2020   Visual Acuity Right Eye Distance:   Left Eye Distance:   Bilateral Distance:    Right Eye Near:   Left Eye Near:    Bilateral Near:     Physical Exam Vitals and nursing note reviewed.  Constitutional:      General: She is not in acute distress.    Appearance: She is well-developed.  HENT:     Head: Normocephalic and atraumatic.  Eyes:     Conjunctiva/sclera: Conjunctivae normal.  Cardiovascular:     Rate and Rhythm: Normal rate and regular rhythm.     Heart sounds: No murmur heard.   Pulmonary:     Effort: Pulmonary effort is normal. No respiratory distress.     Breath sounds: Normal breath sounds. No decreased breath sounds or wheezing.  Abdominal:     Palpations: Abdomen is soft.     Tenderness: There is no abdominal tenderness.  Musculoskeletal:     Cervical back: Neck supple.  Skin:    General: Skin is warm and dry.  Neurological:     Mental Status: She is alert.      UC Treatments / Results  Labs (all labs ordered are listed, but only abnormal results are displayed) Labs Reviewed - No data to display  EKG   Radiology No results found.  Procedures Procedures (including critical care time)  Medications Ordered in UC Medications - No data to display  Initial Impression / Assessment and Plan / UC Course  I have reviewed the triage vital signs and the nursing notes.  Pertinent labs & imaging results that were available during my care of the patient were reviewed by me and considered in my medical decision making (see chart  for details).     Chronic intermittent n/v, she has a follow up with her PCP.  Benign abdominal  exam.  Pt requested medication for headache, toradol given with some improvement.  COVID test pending due to nasal congestion and sore throat, contact with COVID positive pts at work.  Self isolation instructions given.  Return precautions discussed.   Final Clinical Impressions(s) / UC Diagnoses   Final diagnoses:  None   Discharge Instructions   None    ED Prescriptions    None     PDMP not reviewed this encounter.   Jodell Cipro, PA-C 02/29/20 1739

## 2020-03-01 LAB — SARS CORONAVIRUS 2 (TAT 6-24 HRS): SARS Coronavirus 2: NEGATIVE

## 2020-04-08 DIAGNOSIS — F4310 Post-traumatic stress disorder, unspecified: Secondary | ICD-10-CM | POA: Diagnosis not present

## 2020-04-12 DIAGNOSIS — F4310 Post-traumatic stress disorder, unspecified: Secondary | ICD-10-CM | POA: Diagnosis not present

## 2020-05-19 DIAGNOSIS — F4310 Post-traumatic stress disorder, unspecified: Secondary | ICD-10-CM | POA: Diagnosis not present

## 2020-05-24 DIAGNOSIS — F4310 Post-traumatic stress disorder, unspecified: Secondary | ICD-10-CM | POA: Diagnosis not present

## 2020-06-21 ENCOUNTER — Ambulatory Visit (HOSPITAL_COMMUNITY)
Admission: EM | Admit: 2020-06-21 | Discharge: 2020-06-21 | Disposition: A | Discharge status: Home / Self Care | Payer: Medicaid Other | Attending: Urgent Care | Admitting: Urgent Care

## 2020-06-21 ENCOUNTER — Encounter (HOSPITAL_COMMUNITY): Payer: Self-pay | Admitting: Emergency Medicine

## 2020-06-21 ENCOUNTER — Other Ambulatory Visit: Payer: Self-pay

## 2020-06-21 DIAGNOSIS — Z87891 Personal history of nicotine dependence: Secondary | ICD-10-CM | POA: Diagnosis not present

## 2020-06-21 DIAGNOSIS — U071 COVID-19: Secondary | ICD-10-CM | POA: Insufficient documentation

## 2020-06-21 DIAGNOSIS — R059 Cough, unspecified: Secondary | ICD-10-CM | POA: Diagnosis not present

## 2020-06-21 DIAGNOSIS — J069 Acute upper respiratory infection, unspecified: Secondary | ICD-10-CM | POA: Diagnosis not present

## 2020-06-21 LAB — RESP PANEL BY RT-PCR (FLU A&B, COVID) ARPGX2
Influenza A by PCR: NEGATIVE
Influenza B by PCR: NEGATIVE
SARS Coronavirus 2 by RT PCR: POSITIVE — AB

## 2020-06-21 MED ORDER — PROMETHAZINE-DM 6.25-15 MG/5ML PO SYRP: 5.0000 mL | ORAL_SOLUTION | Freq: Every evening | ORAL | 0 refills | Status: DC | PRN

## 2020-06-21 MED ORDER — BENZONATATE 100 MG PO CAPS: 100.0000 mg | ORAL_CAPSULE | Freq: Three times a day (TID) | ORAL | 0 refills | Status: DC | PRN

## 2020-06-21 NOTE — ED Triage Notes (Signed)
Pt is here to get COVID testing .... needs note for work  Sx today include: cough, runny nose, diarrhea  Taking Nyquil for sx w/temp relief.   A&O x4... NAD.Marland Kitchen. ambulatory

## 2020-06-21 NOTE — Discharge Instructions (Signed)

## 2020-06-24 NOTE — ED Provider Notes (Signed)
[name]   MRN: [id number] DOB: 1996-02-07  Subjective:   Stacey [name] is a 25 y.o. female presenting for 2-day history of acute onset cough, runny nose, diarrhea. Has used NyQuil for symptom relief.  No current facility-administered medications for this encounter.  Current Outpatient Medications:  .  benzonatate (TESSALON) 100 MG capsule, Take 1-2 capsules (100-200 mg total) by mouth 3 (three) times daily as needed., Disp: 60 capsule, Rfl: 0 .  promethazine-dextromethorphan (PROMETHAZINE-DM) 6.25-15 MG/5ML syrup, Take 5 mLs by mouth at bedtime as needed for cough., Disp: 100 mL, Rfl: 0 .  ibuprofen (ADVIL) 600 MG tablet, Take 1 tablet (600 mg total) by mouth every 6 (six) hours., Disp: 30 tablet, Rfl: 0 .  polyethylene glycol powder (GLYCOLAX/MIRALAX) 17 GM/SCOOP powder, Take 17 g by mouth daily., Disp: 507 g, Rfl: 1 .  Prenatal Multivit-Min-Fe-FA (PRENATAL/IRON) TABS, Take 1 tablet by mouth every morning., Disp: 30 each, Rfl: 0 .  Prenatal Vit-Fe Fumarate-FA (PNV PRENATAL PLUS MULTIVITAMIN) 27-1 MG TABS, Take 1 Dose by mouth daily., Disp: 90 tablet, Rfl: 0   Allergies  Allergen Reactions  . Penicillins Other (See Comments)    Has patient had a PCN reaction causing immediate rash, facial/tongue/throat swelling, SOB or lightheadedness with hypotension: No Has patient had a PCN reaction causing severe rash involving mucus membranes or skin necrosis: No Has patient had a PCN reaction that required hospitalization No Has pt had a PCN reaction in the last 10 years? No. Nearly universal familial allergy. Pt has never taken PCN.     Past Medical History:  Diagnosis Date  . ADHD   . ADHD (attention deficit hyperactivity disorder)   . Depression   . Mood disorder (HCC)   . ODD (oppositional defiant disorder)   . PTSD (post-traumatic stress disorder)   . PTSD (post-traumatic stress disorder)   . 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 NO PAST SURGERIES      Family History  Problem Relation Age of Onset  . Depression Mother        bipolar, ptsd  . ADD / ADHD Mother   . Depression Father        depression,ptsd  . ADD / ADHD Father   . ADD / ADHD Sister   . ADD / ADHD Brother   . Diabetes Paternal Grandmother   . Diabetes Paternal Grandfather   . Hyperlipidemia Paternal Grandfather     Social History   Tobacco Use  . Smoking status: Former Games developer  . Smokeless tobacco: Never Used  Vaping Use  . Vaping Use: Never used  Substance Use Topics  . Alcohol use: No    Alcohol/week: 0.0 standard drinks  . Drug use: Not Currently    Types: Marijuana    Comment: stopped @ 39months of pregnancy    ROS   Objective:   Vitals: BP 105/68 (BP Location: Left Arm)   Pulse (!) 108   Temp 98.8 F (37.1 C) (Oral)   Resp 20   LMP 05/19/2020   SpO2 96%   Breastfeeding No   Physical Exam Constitutional:      General: She is not in acute distress.    Appearance: Normal appearance. She is well-developed. She is not ill-appearing, toxic-appearing or diaphoretic.  HENT:     Head: Normocephalic and atraumatic.     Nose: Nose normal.     Mouth/Throat:  Mouth: Mucous membranes are moist.     Pharynx: Oropharynx is clear.  Eyes:     General: No scleral icterus.       Right eye: No discharge.        Left eye: No discharge.     Extraocular Movements: Extraocular movements intact.     Conjunctiva/sclera: Conjunctivae normal.     Pupils: Pupils are equal, round, and reactive to light.  Cardiovascular:     Rate and Rhythm: Normal rate.  Pulmonary:     Effort: Pulmonary effort is normal.  Skin:    General: Skin is warm and dry.  Neurological:     General: No focal deficit present.     Mental Status: She is alert and oriented to person, place, and time.  Psychiatric:        Mood and Affect: Mood normal.        Behavior: Behavior normal.        Thought  Content: Thought content normal.        Judgment: Judgment normal.      Assessment and Plan :   PDMP not reviewed this encounter.  1. Viral URI with cough     Patient was called multiple times to be triaged but that was monitored. At closing patient was in the lobby. Recommended supportive care. Labs pending. Counseled patient on potential for adverse effects with medications prescribed/recommended today, ER and return-to-clinic precautions discussed, patient verbalized understanding.    Wallis Bamberg, PA-C 06/24/20 1008

## 2020-06-25 IMAGING — US US MFM OB LIMITED
1 series · 15 of 28 positions shown · non-contrast
Comparison: none

[Series 1: us mfm ob limited · 60 acquisitions, 15 frames shown]
[im 1/60]
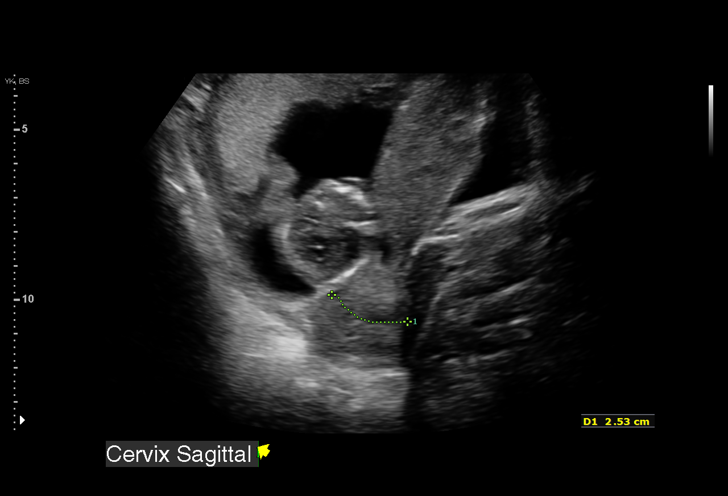
[im 5/60]
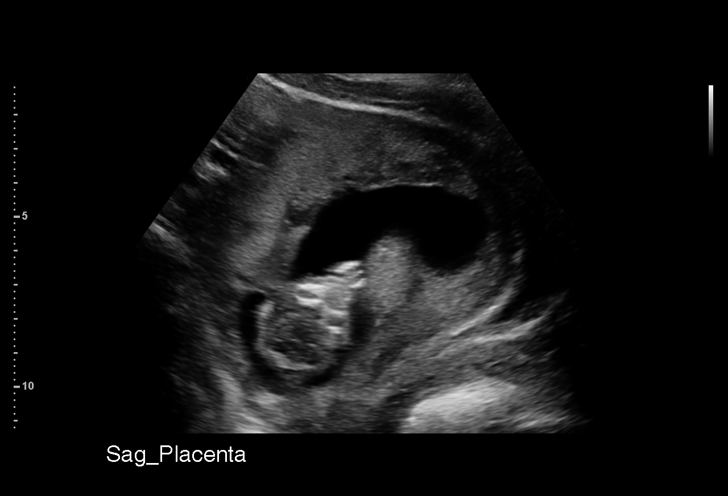
[im 9/60]
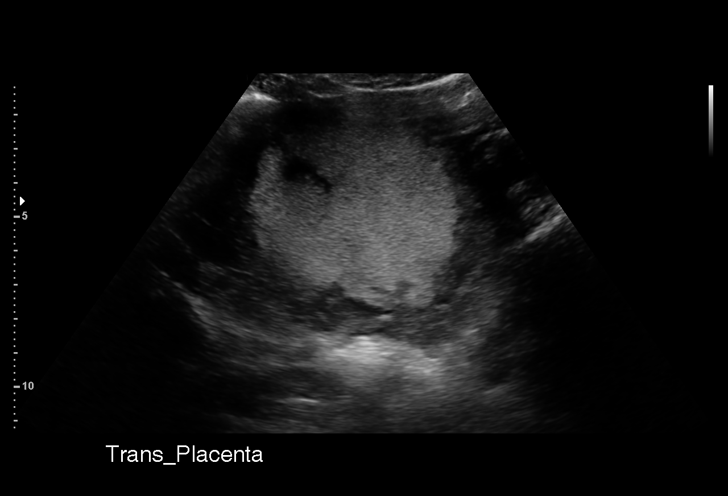
[im 14/60]
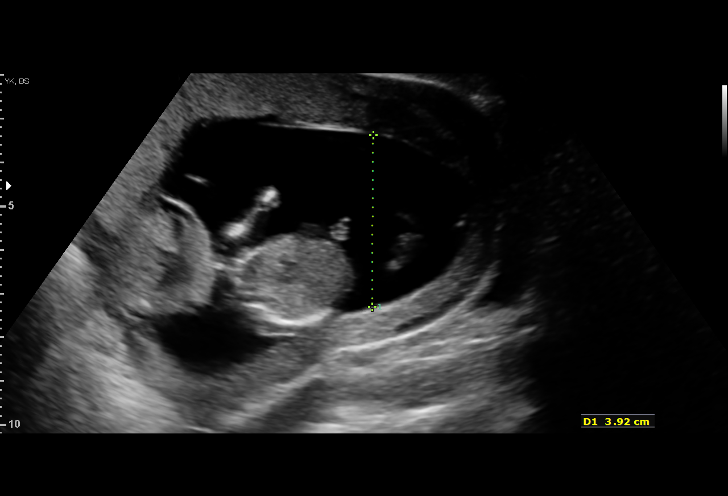
[im 18/60]
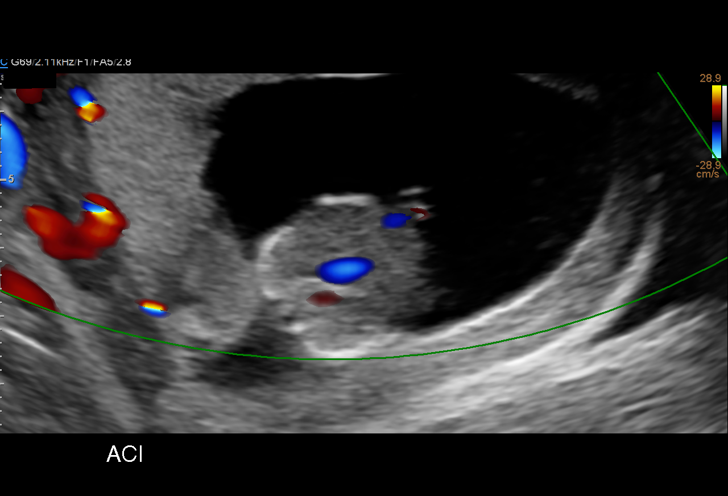
[im 22/60]
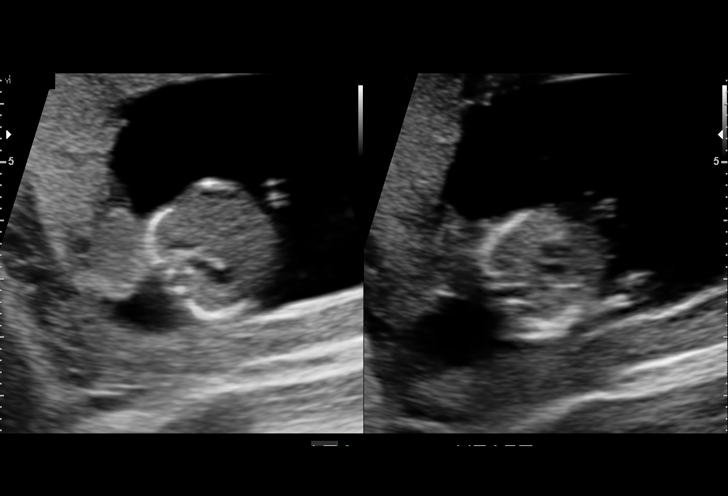
[im 27/60]
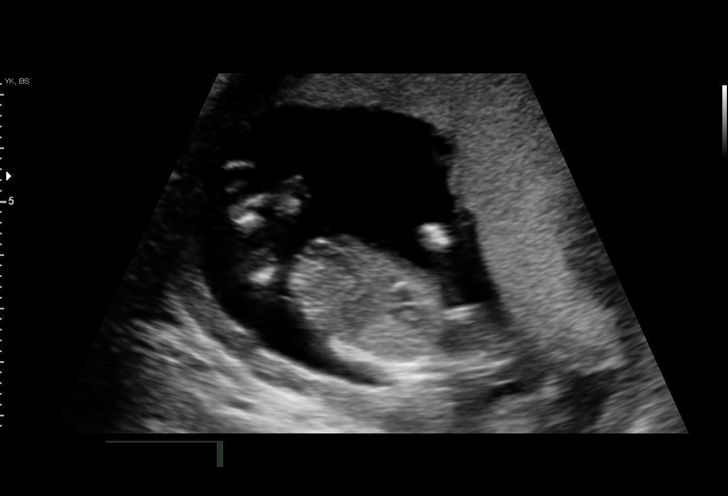
[im 31/60]
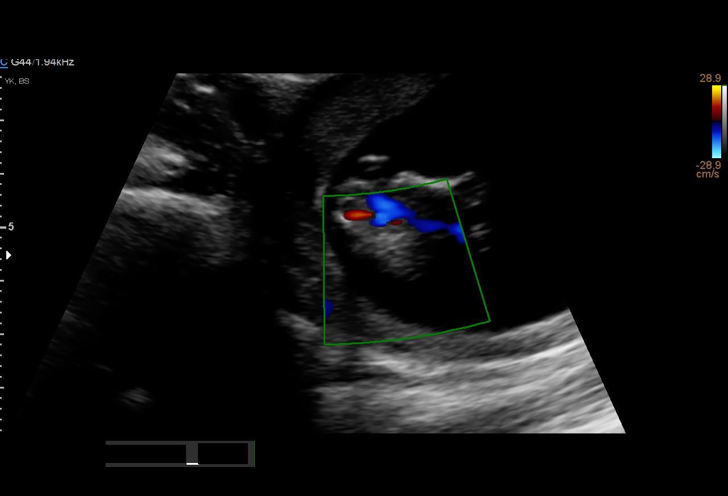
[im 33/60]
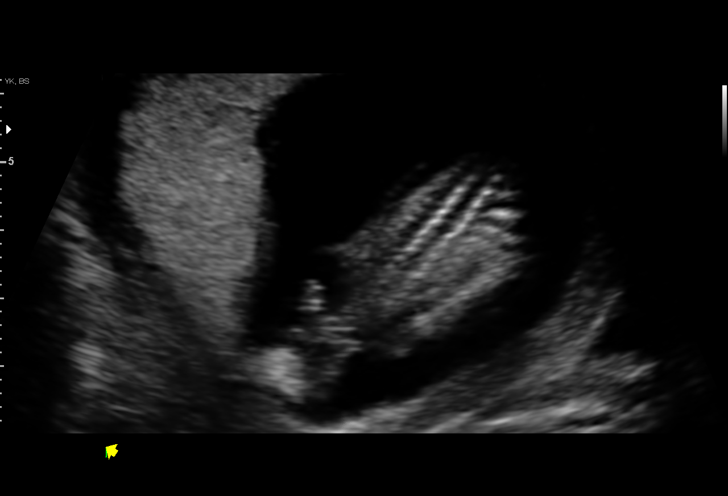
[im 38/60]
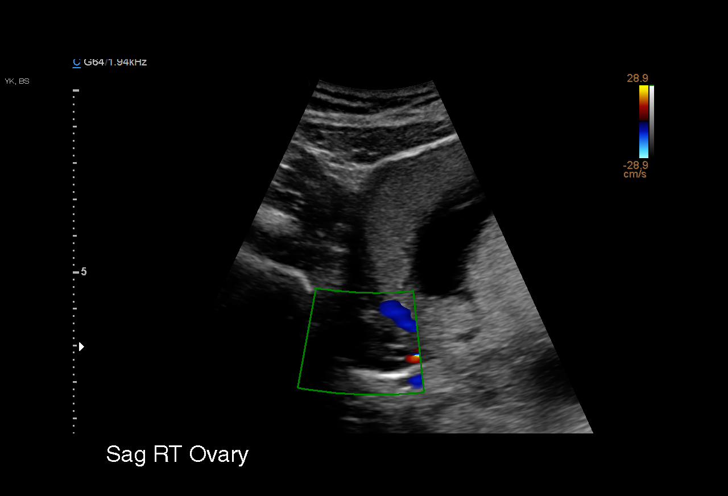
[im 42/60]
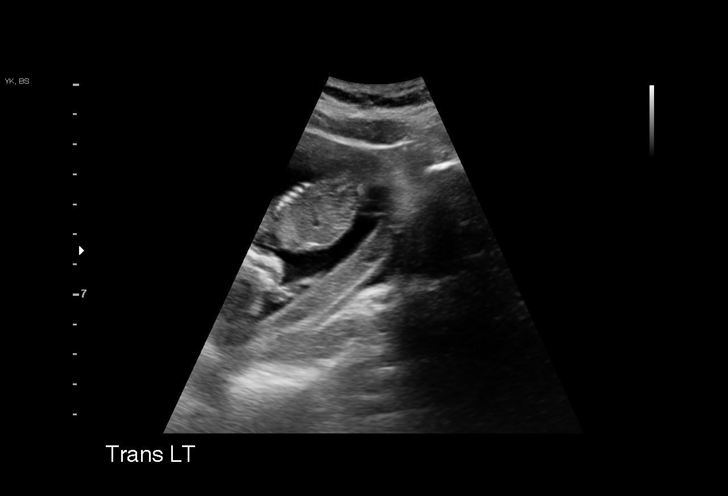
[im 46/60]
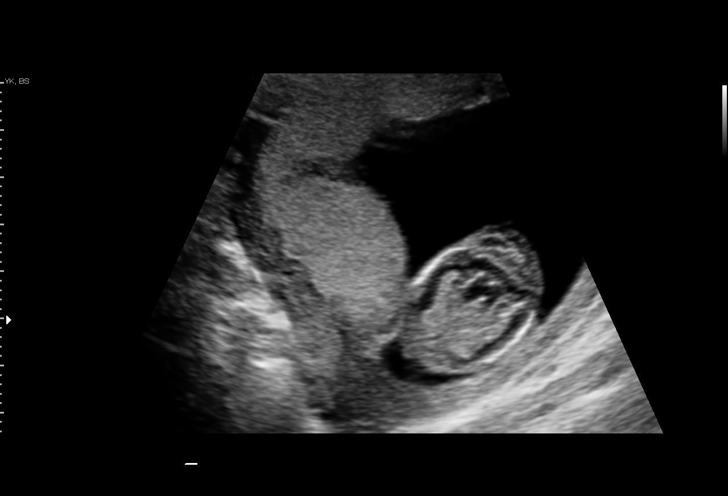
[im 51/60]
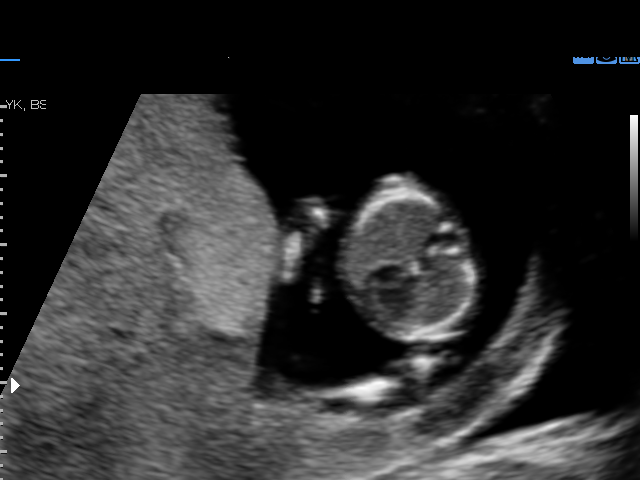
[im 55/60]
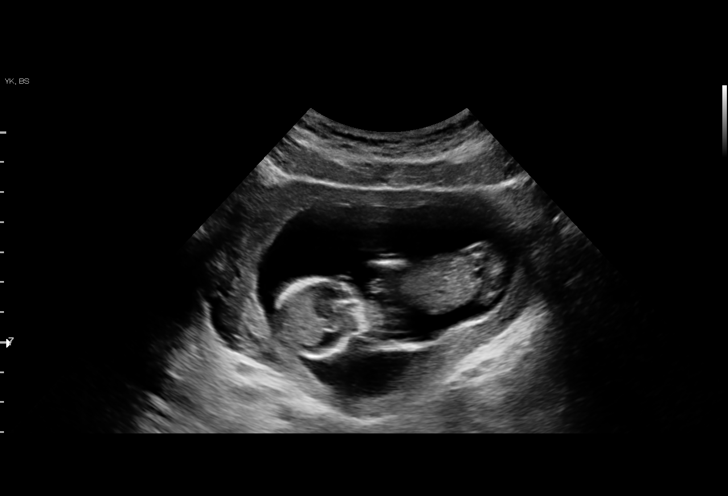
[im 60/60]
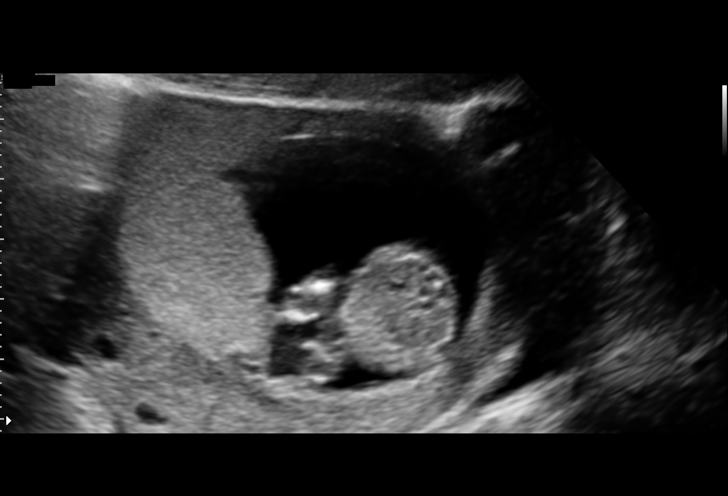

[15 of 28 positions shown; findings below may reference images not displayed]

[REDACTED]

  1  US MFM OB LIMITED                    76815.01     BENRABAH
 ----------------------------------------------------------------------

 ----------------------------------------------------------------------
Indications

  Pelvic pain affecting pregnancy in second
  trimester
  14 weeks gestation of pregnancy
 ----------------------------------------------------------------------
Fetal Evaluation

 Num Of Fetuses:          1
 Fetal Heart Rate(bpm):   171
 Cardiac Activity:        Observed
 Presentation:            Variable
 Placenta:                Posterior

 Amniotic Fluid
 AFI FV:      Within normal limits

                             Largest Pocket(cm)

OB History

 Gravidity:    3          SAB:   1
 TOP:          1
Gestational Age

 LMP:           14w 1d        Date:  05/10/18                 EDD:   02/14/19
 Best:          14w 1d     Det. By:  LMP  (05/10/18)          EDD:   02/14/19
Anatomy
 Choroid Plexus:        Appears normal         Cord Vessels:           Appears normal (3
                                                                       vessel cord)
 Stomach:               Appears normal, left   Kidneys:                Appear normal
                        sided
 Abdominal Wall:        Appears nml (cord      Bladder:                Appears normal
                        insert, abd wall)
Cervix Uterus Adnexa

 Cervix
 Length:           2.53  cm.

 Uterus
 No abnormality visualized.

 Left Ovary
 Within normal limits.

 Right Ovary
 Not visualized.

 Cul De Sac
 No free fluid seen.

 Adnexa
 No abnormality visualized.
Impression

 Early intrauterine viable pregnancy
 No evidence placental abruption
Recommendations

 Follow up anatomy in 4 weeks.

## 2020-07-27 IMAGING — US US MFM OB COMP + 14 WK
1 series · 13 of 28 positions shown · non-contrast
Comparison: none

[Series 1: us mfm ob comp + 14 wk · 13 of 63 slices shown]
[im 3/63]
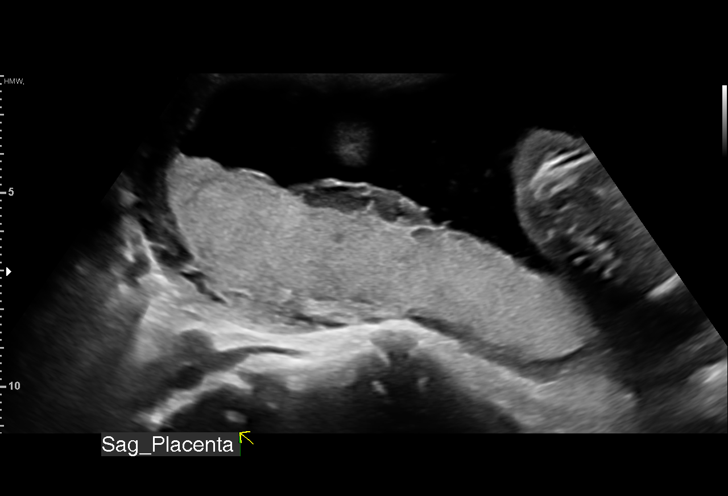
[im 7/63]
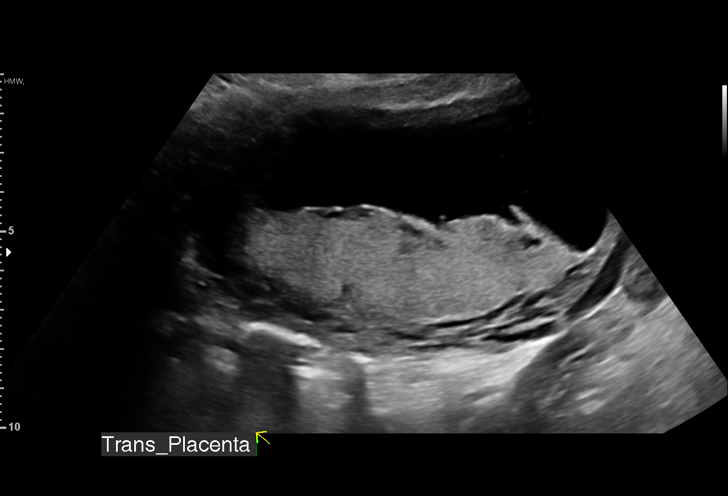
[im 12/63]
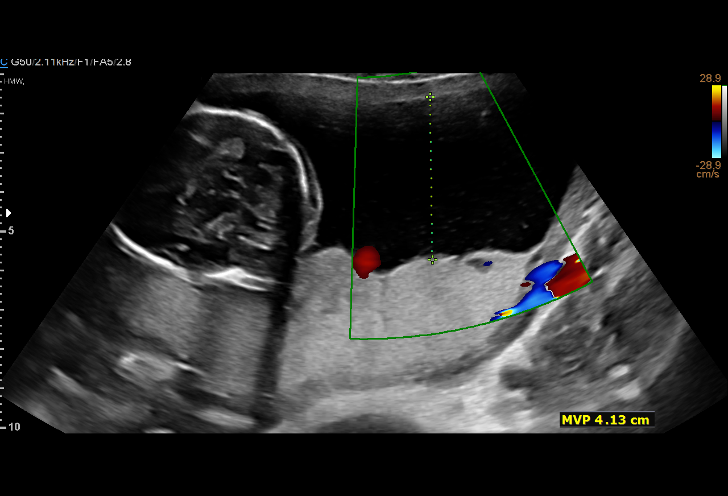
[im 17/63]
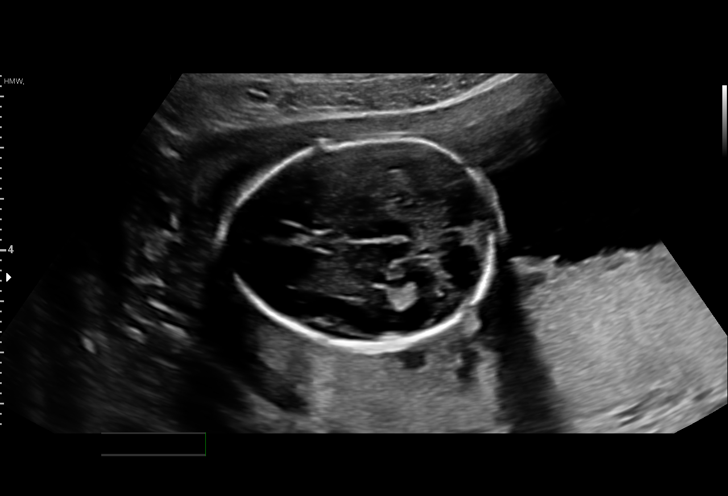
[im 21/63]
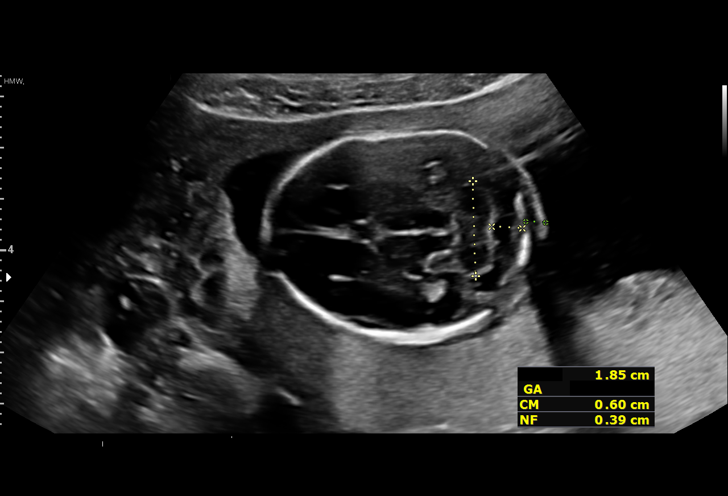
[im 26/63]
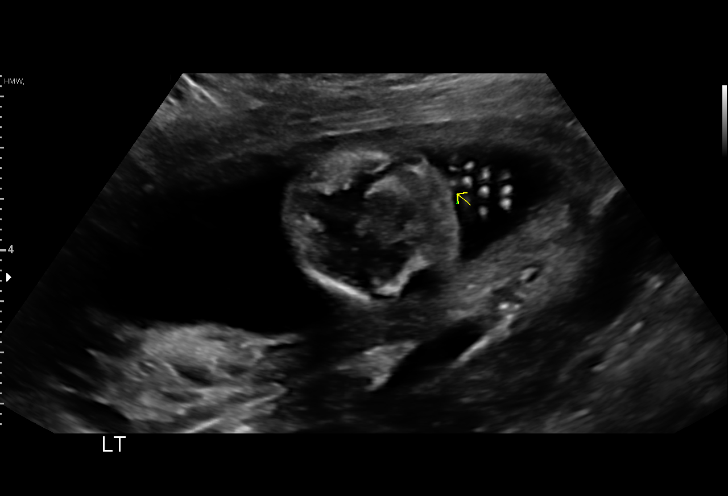
[im 33/63]
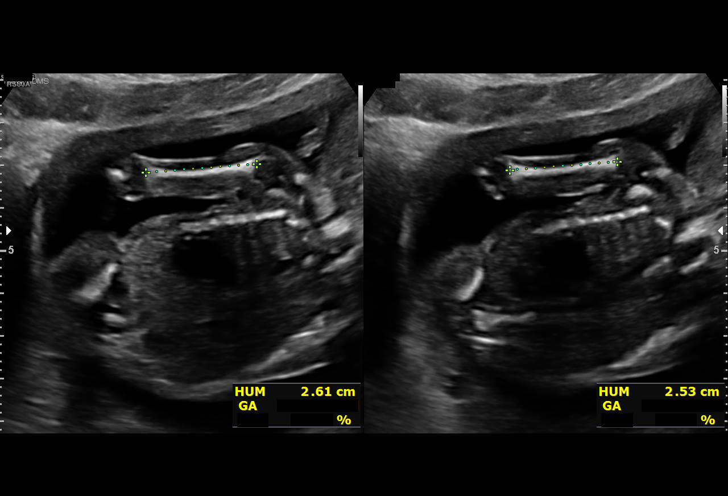
[im 37/63]
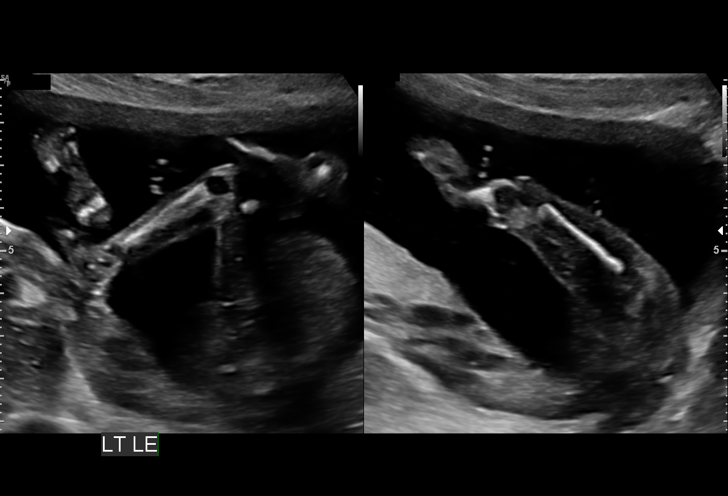
[im 42/63]
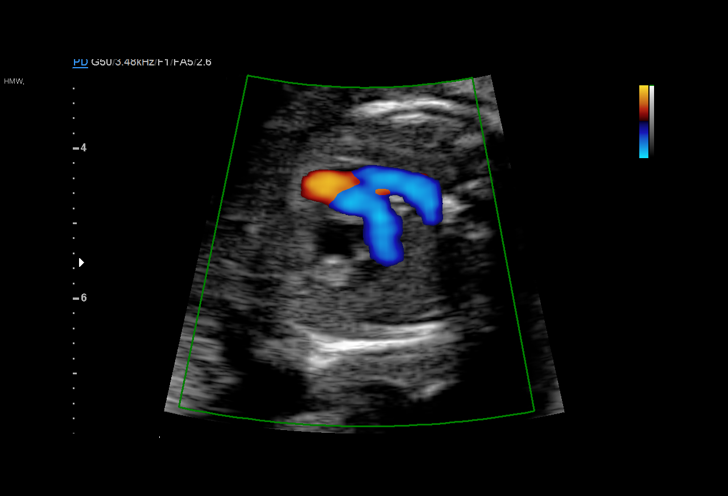
[im 46/63]
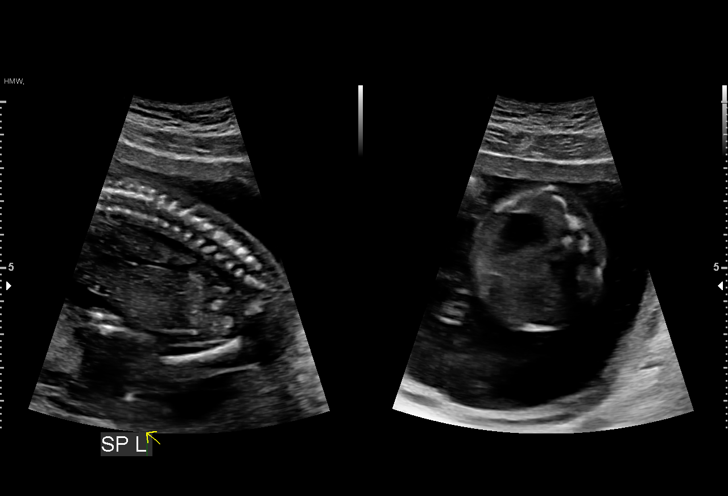
[im 51/63]
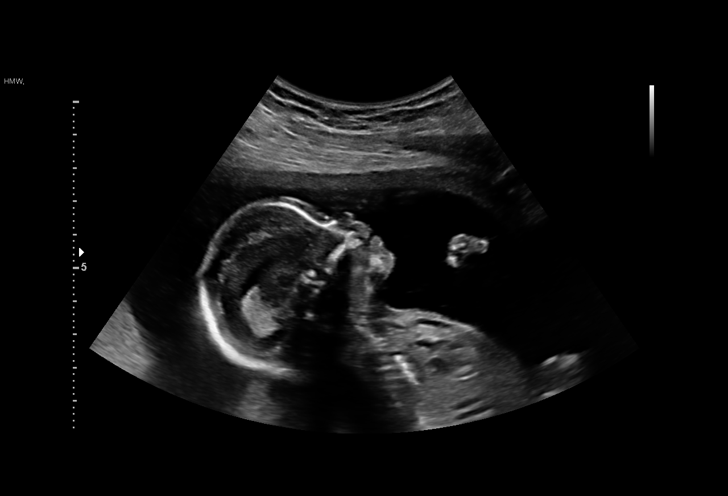
[im 56/63]
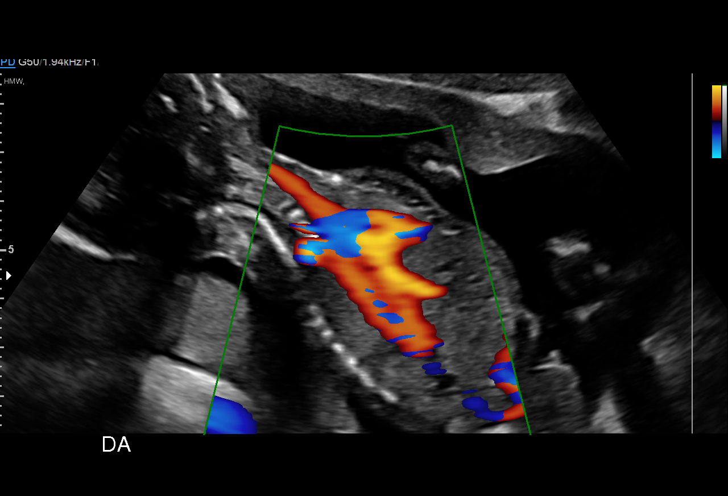
[im 60/63]
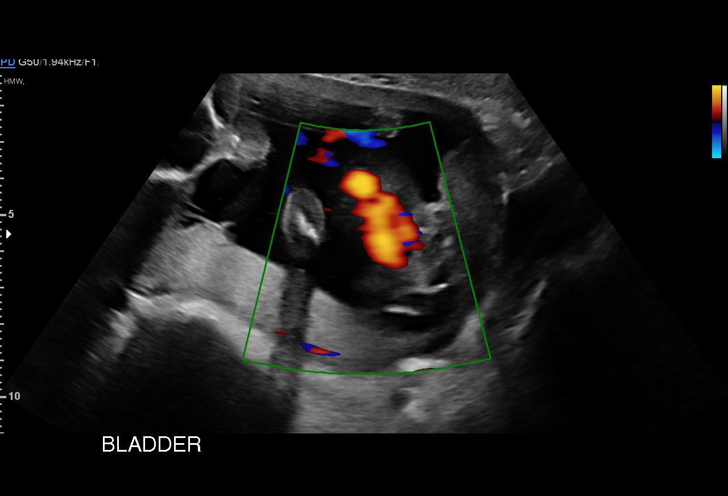

[13 of 28 positions shown; findings below may reference images not displayed]

CNM

 ----------------------------------------------------------------------

 ----------------------------------------------------------------------
Indications

  18 weeks gestation of pregnancy
  Encounter for antenatal screening for
  malformations
 ----------------------------------------------------------------------
Fetal Evaluation

 Num Of Fetuses:          1
 Fetal Heart Rate(bpm):   151
 Cardiac Activity:        Observed
 Presentation:            Breech
 Placenta:                Posterior
 P. Cord Insertion:       Visualized, central

 Amniotic Fluid
 AFI FV:      Within normal limits

                             Largest Pocket(cm)

Biometry

 BPD:      39.3   mm     G. Age:  18w 0d         19  %    CI:         70.04  %    70 - 86
                                                          FL/HC:       16.4  %    16.1 -
 HC:      149.8   mm     G. Age:  18w 1d         15  %    HC/AC:       1.12       1.09 -
 AC:      134.1   mm     G. Age:  18w 6d         52  %    FL/BPD:      62.6  %
 FL:       24.6   mm     G. Age:  17w 3d          8  %    FL/AC:       18.3  %    20 - 24
 HUM:      25.7   mm     G. Age:  18w 0d         34  %
 CER:      18.2   mm     G. Age:  18w 1d         30  %
 NFT:        3.9  mm
 CM:         5.3  mm

 Est. FW:     227   gm      0 lb 8 oz     34  %
OB History

 Gravidity:     3         SAB:   1
 TOP:           1
Gestational Age

 LMP:            18w 5d       Date:  05/10/18                   EDD:  02/14/19
 U/S Today:      18w 1d                                         EDD:  02/18/19
 Best:           18w 5d    Det. By:  LMP  (05/10/18)            EDD:  02/14/19
Anatomy

 Cranium:                Appears normal         Aortic Arch:            Not well visualized
 Cavum:                  Appears normal         Ductal Arch:            Appears normal
 Ventricles:             Appears normal         Diaphragm:              Appears normal
 Choroid Plexus:         Appears normal         Stomach:                Appears normal, left
                                                                        sided
 Cerebellum:             Appears normal         Abdomen:                Appears normal
 Posterior Fossa:        Appears normal         Abdominal Wall:         Appears nml (cord
                                                                        insert, abd wall)
 Nuchal Fold:            Appears normal         Cord Vessels:           Appears normal (3
                                                                        vessel cord)
 Face:                   Appears normal         Kidneys:                Appear normal
                         (orbits and profile)
 Lips:                   Appears normal         Bladder:                Appears normal
 Thoracic:               Appears normal         Spine:                  Appears normal
 Heart:                  Not well visualized    Upper Extremities:      Appears normal
 RVOT:                   Appears normal         Lower Extremities:      Appears normal
 LVOT:                   Appears normal

 Other:   Fetus appears to be a male. Heels and 5th digit visualized. Nasal bone
          visualized.
Cervix Uterus Adnexa

 Cervix
 Length:             3.5  cm.
 Normal appearance by transabdominal scan.

 Uterus
 No abnormality visualized.

 Left Ovary
 No adnexal mass visualized.

 Right Ovary
 No adnexal mass visualized.

 Cul De Sac
 No free fluid seen.
 Adnexa
 No abnormality visualized.
Impression

 Normal interval growth.
 4 chamber view and aortic arch were suboptimally seen given
 the fetal position
Recommendations

 Repeat anatomy in 6 weeks.

## 2020-09-27 DIAGNOSIS — F4310 Post-traumatic stress disorder, unspecified: Secondary | ICD-10-CM | POA: Diagnosis not present

## 2020-10-10 ENCOUNTER — Encounter: Payer: Self-pay | Admitting: Family Medicine

## 2020-10-11 ENCOUNTER — Ambulatory Visit (INDEPENDENT_AMBULATORY_CARE_PROVIDER_SITE_OTHER): Payer: Medicaid Other | Admitting: Family Medicine

## 2020-10-11 ENCOUNTER — Other Ambulatory Visit: Payer: Self-pay

## 2020-10-11 ENCOUNTER — Other Ambulatory Visit (HOSPITAL_COMMUNITY)
Admission: RE | Admit: 2020-10-11 | Discharge: 2020-10-11 | Disposition: A | Discharge status: Home / Self Care | Payer: Medicaid Other | Source: Ambulatory Visit | Attending: Family Medicine | Admitting: Family Medicine

## 2020-10-11 ENCOUNTER — Ambulatory Visit (INDEPENDENT_AMBULATORY_CARE_PROVIDER_SITE_OTHER): Payer: Medicaid Other

## 2020-10-11 ENCOUNTER — Encounter: Payer: Self-pay | Admitting: Family Medicine

## 2020-10-11 VITALS — BP 106/60 | HR 75 | Ht 67.0 in | Wt 143.4 lb

## 2020-10-11 DIAGNOSIS — Z114 Encounter for screening for human immunodeficiency virus [HIV]: Secondary | ICD-10-CM | POA: Diagnosis not present

## 2020-10-11 DIAGNOSIS — Z113 Encounter for screening for infections with a predominantly sexual mode of transmission: Secondary | ICD-10-CM | POA: Insufficient documentation

## 2020-10-11 DIAGNOSIS — D649 Anemia, unspecified: Secondary | ICD-10-CM | POA: Diagnosis not present

## 2020-10-11 DIAGNOSIS — Z1159 Encounter for screening for other viral diseases: Secondary | ICD-10-CM

## 2020-10-11 DIAGNOSIS — Z124 Encounter for screening for malignant neoplasm of cervix: Secondary | ICD-10-CM

## 2020-10-11 DIAGNOSIS — Z23 Encounter for immunization: Secondary | ICD-10-CM | POA: Diagnosis not present

## 2020-10-11 NOTE — Progress Notes (Signed)
    SUBJECTIVE:   CHIEF COMPLAINT / HPI:   Gyn exam/STD screen:  The patient is here for an STD check and PAP f/u. She has not been sexually active for about a year. She wanted to start a new relationship and requested to get STD screened prior. She denies current vaginal discharge, although, since the birth of her child, she has noticed discharges on and off. Her LMP was one week ago. She is on Nexplanon for birth control. Her PAP is due in 6 months. She endorsed a strong family hx of cervical cancer on her mother's side. No other concerns.  Anemia: Feels unnecessarily cold. She has hx of anemia and not on iron pills. She will like to get checked for this.  PERTINENT  PMH / PSH: PMX reviewed.  OBJECTIVE:   BP 106/60   Pulse 75   Ht 5\' 7"  (1.702 m)   Wt 143 lb 6.4 oz (65 kg)   LMP 10/03/2020   SpO2 98%   BMI 22.46 kg/m   Physical Exam Vitals and nursing note reviewed. Exam conducted with a chaperone present 10/05/2020).  Cardiovascular:     Rate and Rhythm: Normal rate and regular rhythm.     Heart sounds: Normal heart sounds. No murmur heard.   Pulmonary:     Effort: Pulmonary effort is normal. No respiratory distress.     Breath sounds: Normal breath sounds. No wheezing.  Abdominal:     General: Abdomen is flat. Bowel sounds are normal.     Palpations: Abdomen is soft. There is no mass.     Tenderness: There is no abdominal tenderness.  Genitourinary:    Exam position: Lithotomy position.     Pubic Area: No rash.      Labia:        Right: No rash.        Left: No rash.      Vagina: Normal.     Cervix: No cervical motion tenderness or discharge.     Comments: Scanty blood from cervical os Musculoskeletal:     Right lower leg: No edema.     Left lower leg: No edema.    Flowsheet Row Office Visit from 10/11/2020 in [facility]  PHQ-9 Total Score 6        ASSESSMENT/PLAN:  Gyn exam: Normal last PAP. Repeat PAP due this year. We  decided to get it out of the way today. I will contact her soon with her result.  STD screen:  HIV, RPR, GC/Chlamydia, Trichomonas checked today. I will contact her soon with her results.   Hx of anemia: CBC checked today.  COVID-19 shot given.  No problem-specific Assessment & Plan notes found for this encounter.     Borgarnes, MD [facility]

## 2020-10-11 NOTE — Patient Instructions (Signed)
Pelvic Exam A pelvic exam is an exam of a woman's outer and inner genitals and reproductive organs. Pelvic exams are done to screen for health problems and to help prevent health problems from developing. During your pelvic exam, your health care provider may ask you questions about your health, your family's health, your menstrual periods, immunizations, and your sexual activity. The information shared between you and your health care provider will not be shared with anyone else. Who should have a pelvic exam? Talk with your health care provider about when and how often you should have a pelvic exam. There are many possible reasons you might have a pelvic exam. A pelvic exam may be recommended to check for:  Normal development and function of the reproductive organs.  Cancer of the cervix, ovaries, uterus, or vagina.  Signs of sexually transmitted infections (STIs) or other types of infections.  Pregnancy. If you are pregnant, a pelvic exam can also help determine how far along you are in your pregnancy.  Widening (dilation) of the cervix during labor.  Injury (trauma) to the reproductive organs. How is a pelvic exam done? Usually, a physical exam is done first. This may include:  An exam of your breasts. Your health care provider may feel your breasts to check for abnormalities.  An exam of your abdomen. Your health care provider may press on your abdomen to check for abnormalities. Pelvic exams may vary among health care providers and hospitals. The following things are usually done during a pelvic exam:  You will remove your clothes from the waist down. You will put on a gown or a wrap to cover yourself while you get ready for the exam.  You will lie on your back on an examination table. Your feet will be placed into foot rests (stirrups) so that your legs are wide apart and your knees are bent. A drape will be placed over your abdomen and your legs.  Your health care provider will  wear gloves and examine your outer genitals to check for anything unusual. This includes your clitoris, urethra, vaginal opening, labia, and the skin between your vagina and your anus (perineum).  Your health care provider will examine your inner genitals. To do this, a lubricated instrument (speculum) will be inserted into your vagina. The speculum will be widened to open the walls of your vagina. ? Your health care provider will examine your vagina and cervix. ? A Pap test, cervical biopsy, or cultures may be done as needed. ? After the internal exam is done, the speculum will be removed.  Your health care provider will insert two fingers into your vagina to gently press against various organs. ? Your health care provider may use his or her other hand to gently press on your lower abdomen while doing this. Depending on the purpose of your pelvic exam, your health care provider may perform:  A Pap test. This is sometimes called a Pap smear. It is a screening test that is used to check for signs of cancer of the cervix. The test can also identify the presence of infection or precancerous changes.  A cervical biopsy. This is the removal of a small sample of tissue from the cervix. The cervix is the lowest part of the womb (uterus), which opens into the vagina (birth canal). The tissue will be checked under a microscope.  Other diagnostic tests that involve taking samples of tissue or fluid (cultures).  If you have tests done, it is your responsibility to   get your test results. Ask your health care provider or the department performing the test when your results will be ready.   What are the benefits of a pelvic exam? A pelvic exam may be recommended to help explain or diagnose:  Changes in your body that may be signs of cancer in the reproductive system.  Inability to get pregnant (infertility).  Vaginal itching or burning.  Abnormal vaginal discharge or bleeding.  Problems with sexual  function.  Problems with urination, such as: ? Painful urination. ? Frequent urinary tract infections. ? Inability to control when you urinate (urinary incontinence).  Problems with menstrual periods, such as: ? Severe cramping. ? Absence of any menstrual flow in a female by the age of 15 years (primary amenorrhea). ? Absence of menstrual flow for 3-6 months at a time (secondary amenorrhea).  Problems with the position of your pelvic organs due to weakening muscles (prolapse), such as: ? Uterine prolapse. ? Bladder prolapse. ? Rectal prolapse. What are the risks of a pelvic exam?  A pelvic exam is usually painless, although it can cause mild discomfort. If you experience pain at any time during your pelvic exam, tell your health care provider right away.  You may have very light bleeding after a pelvic exam, particularly if a biopsy or cultures were obtained. When should I seek medical care? Seek medical care after your pelvic exam if:  You develop new symptoms.  You experience pain or discomfort from anything that was done during your pelvic exam. Summary  A pelvic exam is an exam of a woman's outer and inner genitals and reproductive organs.  A pelvic exam may be recommended to help explain or diagnose various problems with your pelvic organs.  You may experience mild discomfort during a pelvic exam. This information is not intended to replace advice given to you by your health care provider. Make sure you discuss any questions you have with your health care provider. Document Revised: 10/22/2017 Document Reviewed: 10/22/2017 Elsevier Patient Education  2021 [employer].

## 2020-10-12 LAB — CBC WITH DIFFERENTIAL/PLATELET
Basophils Absolute: 0 10*3/uL (ref 0.0–0.2)
Basos: 0 %
EOS (ABSOLUTE): 0.1 10*3/uL (ref 0.0–0.4)
Eos: 1 %
Hematocrit: 37.9 % (ref 34.0–46.6)
Hemoglobin: 12.3 g/dL (ref 11.1–15.9)
Immature Grans (Abs): 0.1 10*3/uL (ref 0.0–0.1)
Immature Granulocytes: 1 %
Lymphocytes Absolute: 1.5 10*3/uL (ref 0.7–3.1)
Lymphs: 21 %
MCH: 29.3 pg (ref 26.6–33.0)
MCHC: 32.5 g/dL (ref 31.5–35.7)
MCV: 90 fL (ref 79–97)
Monocytes Absolute: 0.5 10*3/uL (ref 0.1–0.9)
Monocytes: 7 %
Neutrophils Absolute: 4.9 10*3/uL (ref 1.4–7.0)
Neutrophils: 70 %
Platelets: 324 10*3/uL (ref 150–450)
RBC: 4.2 x10E6/uL (ref 3.77–5.28)
RDW: 12.2 % (ref 11.7–15.4)
WBC: 7 10*3/uL (ref 3.4–10.8)

## 2020-10-12 LAB — HIV ANTIBODY (ROUTINE TESTING W REFLEX): HIV Screen 4th Generation wRfx: NONREACTIVE

## 2020-10-12 LAB — HEPATITIS C ANTIBODY: Hep C Virus Ab: <0.1 s/co ratio (ref 0.0–0.9)

## 2020-10-12 LAB — RPR: RPR Ser Ql: NONREACTIVE

## 2020-10-13 ENCOUNTER — Telehealth: Payer: Self-pay | Admitting: Family Medicine

## 2020-10-13 LAB — CYTOLOGY - PAP
Chlamydia: NEGATIVE
Comment: NEGATIVE
Comment: NEGATIVE
Comment: NORMAL
Diagnosis: NEGATIVE
Neisseria Gonorrhea: NEGATIVE
Trichomonas: POSITIVE — AB

## 2020-10-13 MED ORDER — METRONIDAZOLE 500 MG PO TABS: 500.0000 mg | ORAL_TABLET | Freq: Two times a day (BID) | ORAL | 0 refills | Status: AC

## 2020-10-13 NOTE — Telephone Encounter (Signed)
Result discussed.  Normal PAP.  + Trichomonas discussed.  Advised partner notification and treatment.  Appointment scheduled for TOC in 3 weeks.  I will route message to the RN's team to report this to the health department.

## 2020-10-14 NOTE — Telephone Encounter (Signed)
Per [place] [facility] website Trichomonas is not a reportable disease.  No further action needed. Jone Baseman, CMA

## 2020-10-26 ENCOUNTER — Telehealth: Payer: Self-pay

## 2020-10-26 NOTE — Telephone Encounter (Signed)
-----   Message from Doreene Eland, MD sent at 10/26/2020  8:00 AM EDT ----- Please contact this patient to move her trichomonas test of cure appointment to [facility] June. I reviewed the recommendation, and since she is not pregnant, 2-3 months test of cure is recommended for her as oppose to 3-4 weeks if she was pregnant.  Unless she is still having vaginal discharge despite treatment, she can f/u Mid June for TOC.  Thanks.

## 2020-10-26 NOTE — Telephone Encounter (Signed)
Attempted to reach patient to reschedule TOC appt to mid June. No answer. Unable to LVM. Will try later today.  Aquilla Solian, CMA

## 2020-10-26 NOTE — Telephone Encounter (Addendum)
Attempt made to reschedule her Tric TOC appointment to about 2-3 months based on recommendation. I reviewed the recommendation, and since she is not pregnant, 2-3 months test of cure is recommended for her as oppose to 3-4 weeks if she was pregnant.  Unless she is still having vaginal discharge despite treatment, she can f/u Mid June for TOC.  Will try to reach her again.

## 2020-11-01 ENCOUNTER — Ambulatory Visit: Payer: Medicaid Other | Admitting: Family Medicine

## 2020-11-16 ENCOUNTER — Ambulatory Visit (INDEPENDENT_AMBULATORY_CARE_PROVIDER_SITE_OTHER): Payer: Medicaid Other | Admitting: Family Medicine

## 2020-11-16 DIAGNOSIS — Z91199 Patient's noncompliance with other medical treatment and regimen due to unspecified reason: Secondary | ICD-10-CM

## 2020-11-16 DIAGNOSIS — Z5329 Procedure and treatment not carried out because of patient's decision for other reasons: Secondary | ICD-10-CM

## 2020-11-16 HISTORY — DX: Patient's noncompliance with other medical treatment and regimen due to unspecified reason: Z91.199

## 2020-11-16 NOTE — Progress Notes (Signed)
Patient no-showed her 11/16/2020 visit. Can reschedule as needed.   Peggyann Shoals, DO [facility], PGY-3 11/16/2020 4:22 PM

## 2020-11-23 DIAGNOSIS — F4310 Post-traumatic stress disorder, unspecified: Secondary | ICD-10-CM | POA: Diagnosis not present

## 2020-11-30 DIAGNOSIS — F4310 Post-traumatic stress disorder, unspecified: Secondary | ICD-10-CM | POA: Diagnosis not present

## 2020-12-02 ENCOUNTER — Encounter (HOSPITAL_COMMUNITY): Payer: Self-pay

## 2020-12-02 ENCOUNTER — Ambulatory Visit (HOSPITAL_COMMUNITY)
Admission: EM | Admit: 2020-12-02 | Discharge: 2020-12-02 | Disposition: A | Discharge status: Home / Self Care | Payer: Medicaid Other | Attending: Medical Oncology | Admitting: Medical Oncology

## 2020-12-02 ENCOUNTER — Other Ambulatory Visit: Payer: Self-pay

## 2020-12-02 DIAGNOSIS — N898 Other specified noninflammatory disorders of vagina: Secondary | ICD-10-CM | POA: Diagnosis not present

## 2020-12-02 DIAGNOSIS — Z113 Encounter for screening for infections with a predominantly sexual mode of transmission: Secondary | ICD-10-CM

## 2020-12-02 LAB — HIV ANTIBODY (ROUTINE TESTING W REFLEX): HIV Screen 4th Generation wRfx: NONREACTIVE

## 2020-12-02 MED ORDER — METRONIDAZOLE 500 MG PO TABS: 500.0000 mg | ORAL_TABLET | Freq: Two times a day (BID) | ORAL | 0 refills | Status: DC

## 2020-12-02 NOTE — ED Triage Notes (Signed)
Pt in with c/o redness on her vagina and itching x 1 week  Pt states she had trichomonas recently and finished her medication, but was not consistent with treatment  Denies any vaginal discharge

## 2020-12-02 NOTE — ED Provider Notes (Addendum)
[facility]    CSN: [id number] Arrival date & time: 12/02/20  4944      History   Chief Complaint Chief Complaint  Patient presents with   vaginal redness    HPI Stacey [name] is a 25 y.o. female.   HPI  Vaginal redness: Pt reports that she has had vaginal itching, redness and some discharge for the past week. Symptoms similar to past trichomonas infection. She reports that she had some medication non-compliance with her trichomonas treatment. She denies any new known exposures as she recalls that she has not had any sexual activity since. NO fevers, vomiting or significant pelvic pain.   Past Medical History:  Diagnosis Date   ADHD    ADHD (attention deficit hyperactivity disorder)    Depression    Low weight gain in pregnancy  01/01/2019   7/16- Has gained 3 pounds since last visit. 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 Authorizing Provider  Prenatal Vit-Fe Fumarate-FA (PNV PRENATAL PLUS MULTIVITAMIN) 27-1 MG TABS Take 1 Dose by mouth daily. Patient not taking: Reported on 10/11/2020 10/07/18    Myrene Buddy, MD    Family History Family History  Problem Relation Age of Onset   Depression Mother        bipolar, ptsd   ADD / ADHD Mother    Depression Father        depression,ptsd   ADD / ADHD Father    ADD / ADHD Sister    ADD / ADHD Brother    Diabetes Paternal Grandmother    Diabetes Paternal Grandfather    Hyperlipidemia Paternal Grandfather     Social History Social History   Tobacco Use   Smoking status: Former    Pack years: 0.00   Smokeless tobacco: Never  Vaping Use   Vaping Use: Never used  Substance Use Topics   Alcohol use: No    Alcohol/week: 0.0 standard drinks   Drug use: Not Currently    Types: Marijuana    Comment: stopped @ 55months of pregnancy     Allergies   Penicillins   Review of Systems Review of Systems  As stated above in HPI Physical Exam Triage Vital Signs ED Triage Vitals  Enc Vitals Group     BP 12/02/20 1024 104/68  Pulse Rate 12/02/20 1024 69     Resp 12/02/20 1024 18     Temp 12/02/20 1027 98.2 F (36.8 C)     Temp Source 12/02/20 1024 Oral     SpO2 12/02/20 1024 100 %     Weight --      Height --      Head Circumference --      Peak Flow --      Pain Score 12/02/20 1023 1     Pain Loc --      Pain Edu? --      Excl. in GC? --    No data found.  Updated Vital Signs BP 104/68 (BP Location: Right Arm)   Pulse 69   Temp 98.2 F (36.8 C) (Oral)   Resp 18   SpO2 100%   Physical Exam Vitals and nursing note reviewed.  Constitutional:      General: She is not in acute distress.    Appearance: Normal appearance. She is not ill-appearing, toxic-appearing or diaphoretic.  Cardiovascular:     Rate and Rhythm: Normal rate and regular rhythm.     Heart sounds: Normal heart sounds.  Pulmonary:     Effort: Pulmonary effort is normal.     Breath sounds: Normal breath sounds.  Genitourinary:    Comments: Pt performs self swab collection Lymphadenopathy:     Cervical: No cervical adenopathy.   Neurological:     Mental Status: She is alert and oriented to person, place, and time.     UC Treatments / Results  Labs (all labs ordered are listed, but only abnormal results are displayed) Labs Reviewed  RPR  HIV ANTIBODY (ROUTINE TESTING W REFLEX)  CERVICOVAGINAL ANCILLARY ONLY    EKG   Radiology No results found.  Procedures Procedures (including critical care time)  Medications Ordered in UC Medications - No data to display  Initial Impression / Assessment and Plan / UC Course  I have reviewed the triage vital signs and the nursing notes.  Pertinent labs & imaging results that were available during my care of the patient were reviewed by me and considered in my medical decision making (see chart for details).     New. Screening for STI today. She demands treatment today for trichomonas stating that she has not been sexually active since she was last treated.  Since she acknowledges that she failed taking the medication as directed and has not had any new partners I am agreeable to this. Final Clinical Impressions(s) / UC Diagnoses   Final diagnoses:  None   Discharge Instructions   None    ED Prescriptions   None    PDMP not reviewed this encounter.   Rushie Chestnut, PA-C 12/02/20 1050    [address] [place] 12/02/20 1051

## 2020-12-03 LAB — RPR: RPR Ser Ql: NONREACTIVE

## 2020-12-06 LAB — CERVICOVAGINAL ANCILLARY ONLY
Bacterial Vaginitis (gardnerella): NEGATIVE
Candida Glabrata: NEGATIVE
Candida Vaginitis: NEGATIVE
Chlamydia: NEGATIVE
Comment: NEGATIVE
Comment: NEGATIVE
Comment: NEGATIVE
Comment: NEGATIVE
Comment: NEGATIVE
Comment: NORMAL
Neisseria Gonorrhea: NEGATIVE
Trichomonas: NEGATIVE

## 2020-12-09 DIAGNOSIS — F4310 Post-traumatic stress disorder, unspecified: Secondary | ICD-10-CM | POA: Diagnosis not present

## 2020-12-14 ENCOUNTER — Other Ambulatory Visit: Payer: Self-pay

## 2020-12-14 ENCOUNTER — Ambulatory Visit (HOSPITAL_COMMUNITY)
Admission: EM | Admit: 2020-12-14 | Discharge: 2020-12-14 | Disposition: A | Discharge status: Home / Self Care | Payer: Medicaid Other | Attending: Internal Medicine | Admitting: Internal Medicine

## 2020-12-14 ENCOUNTER — Encounter (HOSPITAL_COMMUNITY): Payer: Self-pay

## 2020-12-14 DIAGNOSIS — N912 Amenorrhea, unspecified: Secondary | ICD-10-CM | POA: Diagnosis not present

## 2020-12-14 DIAGNOSIS — R112 Nausea with vomiting, unspecified: Secondary | ICD-10-CM

## 2020-12-14 DIAGNOSIS — R1030 Lower abdominal pain, unspecified: Secondary | ICD-10-CM | POA: Diagnosis not present

## 2020-12-14 LAB — POCT URINALYSIS DIPSTICK, ED / UC
Bilirubin Urine: NEGATIVE
Glucose, UA: NEGATIVE mg/dL
Hgb urine dipstick: NEGATIVE
Ketones, ur: NEGATIVE mg/dL
Leukocytes,Ua: NEGATIVE
Nitrite: NEGATIVE
Protein, ur: NEGATIVE mg/dL
Specific Gravity, Urine: 1.015 (ref 1.005–1.030)
Urobilinogen, UA: 0.2 mg/dL (ref 0.0–1.0)
pH: 6 (ref 5.0–8.0)

## 2020-12-14 LAB — POC URINE PREG, ED: Preg Test, Ur: NEGATIVE

## 2020-12-14 MED ORDER — ONDANSETRON 8 MG PO TBDP: 8.0000 mg | ORAL_TABLET | Freq: Three times a day (TID) | ORAL | 0 refills | Status: DC | PRN

## 2020-12-14 NOTE — Discharge Instructions (Addendum)
Make sure you push fluids drinking mostly water but mix it with Gatorade.  Try to eat light meals including soups, broths and soft foods, fruits.  You may use Zofran for your nausea and vomiting once every 8 hours.   Please return to the clinic if symptoms worsen or you start having severe abdominal pain not helped by taking Tylenol or start having bloody stools or blood in the vomit.  

## 2020-12-14 NOTE — ED Provider Notes (Signed)
[name]   MRN: [id number] DOB: 12-10-1995  Subjective:   Stacey [name] is a 25 y.o. female presenting for 5 to 6-day history of persistent intermittent mild lower abdominal pain, nausea with vomiting.  Patient admits that she drank heavily last week especially on the weekend.  She also does not hydrate much with water and drinks a lot of Pedialyte.  LMP was 11/07/2020, would like to make sure she is not pregnant.  She was seen 2 weeks ago and tested for STIs, results were negative.  She did have what she describes as a labial tear and was advised to use topical treatment, was prescribed an antifungal medication.  She reports some improvement.  She does have a gynecologist but has not followed up with them.  Denies fever, vaginal discharge, genital rash, dysuria, urinary frequency, hematuria, flank pain, diarrhea, body aches, recent long distance travel, recent antibiotic use.  No history of GI issues.  No marijuana use.  No current facility-administered medications for this encounter.  Current Outpatient Medications:    UNABLE TO FIND, Med Name: pt reports having birth control implant., Disp: , Rfl:    metroNIDAZOLE (FLAGYL) 500 MG tablet, Take 1 tablet (500 mg total) by mouth 2 (two) times daily., Disp: 14 tablet, Rfl: 0   Prenatal Vit-Fe Fumarate-FA (PNV PRENATAL PLUS MULTIVITAMIN) 27-1 MG TABS, Take 1 Dose by mouth daily. (Patient not taking: Reported on 10/11/2020), Disp: 90 tablet, Rfl: 0   Allergies  Allergen Reactions   Penicillins Other (See Comments)    Has patient had a PCN reaction causing immediate rash, facial/tongue/throat swelling, SOB or lightheadedness with hypotension: No Has patient had a PCN reaction causing severe rash involving mucus membranes or skin necrosis: No Has patient had a PCN reaction that required hospitalization No Has pt had a PCN reaction in the last 10 years? No. Nearly universal familial allergy. Pt has never taken PCN.      Past Medical History:  Diagnosis Date   ADHD    ADHD (attention deficit hyperactivity disorder)    Depression    Low weight gain in pregnancy  01/01/2019   7/16- Has gained 3 pounds since last visit. 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)      Past Surgical History:  Procedure Laterality Date   NO PAST SURGERIES      Family History  Problem Relation Age of Onset   Depression Mother        bipolar, ptsd   ADD / ADHD Mother    Depression Father        depression,ptsd   ADD / ADHD Father    ADD / ADHD Sister    ADD / ADHD Brother    Diabetes Paternal Grandmother    Diabetes Paternal Grandfather    Hyperlipidemia Paternal Grandfather     Social History   Tobacco Use   Smoking status: Former    Pack years: 0.00   Smokeless tobacco: Never  Vaping Use   Vaping Use: Every day  Substance Use Topics   Alcohol use: No    Alcohol/week: 0.0 standard drinks   Drug use: Not Currently  Types: Marijuana    Comment: stopped @ 55months of pregnancy    ROS   Objective:   Vitals: BP 110/70   Pulse 84   Temp 99 F (37.2 C)   Resp 18   LMP 11/06/2020 Comment: reports has birth control implant  SpO2 99%   Physical Exam Constitutional:      General: She is not in acute distress.    Appearance: Normal appearance. She is well-developed and normal weight. She is not ill-appearing, toxic-appearing or diaphoretic.  HENT:     Head: Normocephalic and atraumatic.     Right Ear: External ear normal.     Left Ear: External ear normal.     Nose: Nose normal.     Mouth/Throat:     Mouth: Mucous membranes are moist.     Pharynx: Oropharynx is  clear.  Eyes:     General: No scleral icterus.    Extraocular Movements: Extraocular movements intact.     Pupils: Pupils are equal, round, and reactive to light.  Cardiovascular:     Rate and Rhythm: Normal rate and regular rhythm.     Heart sounds: Normal heart sounds. No murmur heard.   No friction rub. No gallop.  Pulmonary:     Effort: Pulmonary effort is normal. No respiratory distress.     Breath sounds: Normal breath sounds. No stridor. No wheezing, rhonchi or rales.  Abdominal:     General: Bowel sounds are normal. There is no distension.     Palpations: Abdomen is soft. There is no mass.     Tenderness: There is abdominal tenderness (mild) in the left lower quadrant. There is no right CVA tenderness, left CVA tenderness, guarding or rebound.  Skin:    General: Skin is warm and dry.     Coloration: Skin is not pale.     Findings: No rash.  Neurological:     General: No focal deficit present.     Mental Status: She is alert and oriented to person, place, and time.  Psychiatric:        Mood and Affect: Mood normal.        Behavior: Behavior normal.        Thought Content: Thought content normal.        Judgment: Judgment normal.    Results for orders placed or performed during the hospital encounter of 12/14/20 (from the past 24 hour(s))  POC Urinalysis dipstick     Status: None   Collection Time: 12/14/20  9:00 AM  Result Value Ref Range   Glucose, UA NEGATIVE NEGATIVE mg/dL   Bilirubin Urine NEGATIVE NEGATIVE   Ketones, ur NEGATIVE NEGATIVE mg/dL   Specific Gravity, Urine 1.015 1.005 - 1.030   Hgb urine dipstick NEGATIVE NEGATIVE   pH 6.0 5.0 - 8.0   Protein, ur NEGATIVE NEGATIVE mg/dL   Urobilinogen, UA 0.2 0.0 - 1.0 mg/dL   Nitrite NEGATIVE NEGATIVE   Leukocytes,Ua NEGATIVE NEGATIVE  POC urine pregnancy     Status: None   Collection Time: 12/14/20  9:02 AM  Result Value Ref Range   Preg Test, Ur NEGATIVE NEGATIVE    Assessment and Plan :   PDMP not  reviewed this encounter.  1. Lower abdominal pain   2. Non-intractable vomiting with nausea, unspecified vomiting type   3. Amenorrhea     Recommended conservative management with supportive care and fluids, close follow-up with her gynecologist.  Offered patient recheck of her STI labs but she refused.  As she  is improved with her genital symptoms, will defer pelvic exam. Counseled patient on potential for adverse effects with medications prescribed/recommended today, ER and return-to-clinic precautions discussed, patient verbalized understanding.    Wallis Bamberg, [place] 12/14/20 [phone]

## 2020-12-14 NOTE — ED Triage Notes (Signed)
Pt c/o abdominal pain, emesis starting Thursday of last week.  LMP 11/07/20.

## 2020-12-16 DIAGNOSIS — F4310 Post-traumatic stress disorder, unspecified: Secondary | ICD-10-CM | POA: Diagnosis not present

## 2020-12-23 DIAGNOSIS — F4310 Post-traumatic stress disorder, unspecified: Secondary | ICD-10-CM | POA: Diagnosis not present

## 2020-12-27 ENCOUNTER — Ambulatory Visit: Payer: Medicaid Other | Admitting: Family Medicine

## 2021-02-22 ENCOUNTER — Other Ambulatory Visit: Payer: Self-pay

## 2021-02-22 ENCOUNTER — Ambulatory Visit (INDEPENDENT_AMBULATORY_CARE_PROVIDER_SITE_OTHER): Payer: Medicaid Other

## 2021-02-22 DIAGNOSIS — Z111 Encounter for screening for respiratory tuberculosis: Secondary | ICD-10-CM | POA: Diagnosis not present

## 2021-02-22 NOTE — Progress Notes (Signed)
Patient is here for a PPD placement.  PPD placed in left forearm @ 1045 am.  Patient will return 02/24/2021 to have PPD read. Veronda Prude, RN

## 2021-02-24 ENCOUNTER — Ambulatory Visit: Payer: Medicaid Other

## 2021-03-06 ENCOUNTER — Ambulatory Visit (INDEPENDENT_AMBULATORY_CARE_PROVIDER_SITE_OTHER): Payer: Medicaid Other

## 2021-03-06 ENCOUNTER — Other Ambulatory Visit: Payer: Self-pay

## 2021-03-06 DIAGNOSIS — Z111 Encounter for screening for respiratory tuberculosis: Secondary | ICD-10-CM

## 2021-03-06 NOTE — Progress Notes (Signed)
Patient is here for a PPD placement.   PPD placed in right forearm @ 11am.   Patient will return 03/08/2021 to have PPD read.

## 2021-03-08 ENCOUNTER — Other Ambulatory Visit: Payer: Self-pay

## 2021-03-08 ENCOUNTER — Ambulatory Visit (INDEPENDENT_AMBULATORY_CARE_PROVIDER_SITE_OTHER): Payer: Medicaid Other

## 2021-03-08 DIAGNOSIS — Z111 Encounter for screening for respiratory tuberculosis: Secondary | ICD-10-CM

## 2021-03-08 LAB — TB SKIN TEST
Induration: 0 mm
TB Skin Test: NEGATIVE

## 2021-03-08 NOTE — Progress Notes (Signed)
Patient is here for a PPD read.  It was placed on 03/06/2021 in the left forearm @ 1100 am.    PPD RESULTS:  Result: negative Induration: 0 mm  Letter created and given to patient for documentation purposes. Veronda Prude, RN

## 2021-03-29 DIAGNOSIS — F4310 Post-traumatic stress disorder, unspecified: Secondary | ICD-10-CM | POA: Diagnosis not present

## 2021-05-31 ENCOUNTER — Ambulatory Visit (INDEPENDENT_AMBULATORY_CARE_PROVIDER_SITE_OTHER): Payer: Medicaid Other | Admitting: Family Medicine

## 2021-05-31 ENCOUNTER — Other Ambulatory Visit: Payer: Self-pay

## 2021-05-31 VITALS — BP 99/67 | HR 81 | Wt 143.2 lb

## 2021-05-31 DIAGNOSIS — Z3046 Encounter for surveillance of implantable subdermal contraceptive: Secondary | ICD-10-CM | POA: Diagnosis present

## 2021-05-31 DIAGNOSIS — Z975 Presence of (intrauterine) contraceptive device: Secondary | ICD-10-CM

## 2021-05-31 NOTE — Assessment & Plan Note (Signed)
Patient reports placed August 2020.  Mildly displaced.  Not causing any symptoms.  Advised to keep in and continue monitoring.

## 2021-05-31 NOTE — Progress Notes (Signed)
° ° °  SUBJECTIVE:   CHIEF COMPLAINT / HPI:   Nexplanon migration Nexplanon placed in August 2020.  She noticed the other day that the Nexplanon had moved and is not where it was initially inserted.  She denies any pain and otherwise does not bother her.  She sees a therapist for mood disorder.  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  She is think about removing the Nexplanon when she finishes with nursing school.  PERTINENT  PMH / PSH: Nexplanon in place, postpartum depression  OBJECTIVE:   BP 99/67    Pulse 81    Wt 143 lb 3.2 oz (65 kg)    SpO2 100%    BMI 22.43 kg/m   General: Alert, NAD CV: Normal rate Pulm: No respiratory distress Extremities: Nexplanon visualized approximately 2 cm inferior scar from insertion site  Depression screen [facility] 2/9 10/11/2020  Decreased Interest 1  Down, Depressed, Hopeless 1  PHQ - 2 Score 2  Altered sleeping 1  Tired, decreased energy 1  Change in appetite 1  Feeling bad or failure about yourself  0  Trouble concentrating 1  Moving slowly or fidgety/restless 0  Suicidal thoughts 0  PHQ-9 Score 6  Difficult doing work/chores Not difficult at all  Some recent data might be hidden     ASSESSMENT/PLAN:   Nexplanon in place Patient reports placed August 2020.  Mildly displaced.  Not causing any symptoms.  Advised to keep in and continue monitoring.     Littie Deeds, MD [facility]

## 2021-09-27 ENCOUNTER — Encounter: Payer: Self-pay | Admitting: Family Medicine

## 2021-09-27 NOTE — Progress Notes (Addendum)
After the visit, I met with the patient's mom. I talked about her encounter with the staff at the front desk. She apologized profusely and stated that she was stressed at the time. She requested that I personally apologize on her behalf to [place]. She is aware that this could result in dismissal from our practice in the future. I discussed our clinic policy regarding inappropriate behavior toward any of our staff members. She verbalized understanding. ? ?Stacey [name] chaperoned the discussion. ?

## 2021-11-21 ENCOUNTER — Encounter: Payer: Self-pay | Admitting: *Deleted

## 2022-02-15 ENCOUNTER — Telehealth: Payer: Self-pay | Admitting: Family Medicine

## 2022-02-15 NOTE — Telephone Encounter (Signed)
I tried to reach the patient regarding her scheduled appointment for 02/16/22. I was unable to leave a message. Should she call back, please advise:  She was scheduled for two separate appointment for her visit with me tomorrow. Unfortunately, the way the schedule work, we would only be able to do one of the two procedures and not both at that visit.

## 2022-02-16 ENCOUNTER — Telehealth: Payer: Self-pay

## 2022-02-16 ENCOUNTER — Ambulatory Visit: Payer: Medicaid Other | Admitting: Family Medicine

## 2022-02-16 NOTE — Telephone Encounter (Signed)
Tried calling pt this morning to see which procedure she wanted to do before she got her but the pt did not answer. I will check with her if she comes to her appt today.

## 2022-02-16 NOTE — Telephone Encounter (Signed)
-----   Message from Doreene Eland, MD sent at 02/14/2022  7:33 PM EDT ----- Hello team,   Please reach out to patient and advise her that she would only be ale to get one procedure done for her Friday visit with me. She will need to come back in for the other procedure. Unless she is willing to wait till other patients are seen.  Thanks.

## 2022-04-11 ENCOUNTER — Ambulatory Visit (INDEPENDENT_AMBULATORY_CARE_PROVIDER_SITE_OTHER): Payer: Medicaid Other

## 2022-04-11 DIAGNOSIS — Z111 Encounter for screening for respiratory tuberculosis: Secondary | ICD-10-CM

## 2022-04-12 NOTE — Progress Notes (Signed)
Patient is here for a PPD placement.  PPD placed in left forearm @ 09:25 am.  Patient will return 04/13/2022 to have PPD read. Talbot Grumbling, RN

## 2022-04-13 ENCOUNTER — Ambulatory Visit (INDEPENDENT_AMBULATORY_CARE_PROVIDER_SITE_OTHER): Payer: Medicaid Other

## 2022-04-13 DIAGNOSIS — Z111 Encounter for screening for respiratory tuberculosis: Secondary | ICD-10-CM

## 2022-04-13 LAB — TB SKIN TEST
Induration: 0 mm
TB Skin Test: NEGATIVE

## 2022-04-13 NOTE — Progress Notes (Signed)
Patient is here for a PPD read.  It was placed on 04/11/2022 in the left forearm @ 0925 am.    PPD RESULTS:  Result: negative Induration: 0 mm  Letter created and given to patient for documentation purposes. Talbot Grumbling, RN

## 2022-04-17 ENCOUNTER — Ambulatory Visit: Payer: Medicaid Other | Admitting: Family Medicine

## 2022-04-23 ENCOUNTER — Telehealth: Payer: Self-pay | Admitting: Family Medicine

## 2022-04-23 NOTE — Telephone Encounter (Signed)
I called to confirm her appointment with me tomorrow at 9:30 am for nexplanon removal. She sick she is sick and would like to come in for a sick visit instead. I offered to switch her AM procedure to sick visit, but she prefers a PM appointment. Access to care appointment made. I will message Dr. Jeani Hawking, who see's her tomorrow as well. She will reschedule her Nexplanon appointment.

## 2022-04-24 ENCOUNTER — Ambulatory Visit (INDEPENDENT_AMBULATORY_CARE_PROVIDER_SITE_OTHER): Payer: Medicaid Other | Admitting: Family Medicine

## 2022-04-24 ENCOUNTER — Ambulatory Visit: Payer: Medicaid Other | Admitting: Family Medicine

## 2022-04-24 ENCOUNTER — Encounter: Payer: Self-pay | Admitting: Family Medicine

## 2022-04-24 VITALS — BP 108/68 | HR 84 | Temp 98.1°F | Ht 68.0 in | Wt 148.0 lb

## 2022-04-24 DIAGNOSIS — N926 Irregular menstruation, unspecified: Secondary | ICD-10-CM | POA: Insufficient documentation

## 2022-04-24 DIAGNOSIS — J069 Acute upper respiratory infection, unspecified: Secondary | ICD-10-CM | POA: Diagnosis present

## 2022-04-24 LAB — POCT URINE PREGNANCY: Preg Test, Ur: NEGATIVE

## 2022-04-24 MED ORDER — FLUTICASONE PROPIONATE 50 MCG/ACT NA SUSP: 2.0000 | Freq: Every day | NASAL | 6 refills | Status: AC

## 2022-04-24 MED ORDER — BENZONATATE 100 MG PO CAPS: 100.0000 mg | ORAL_CAPSULE | Freq: Two times a day (BID) | ORAL | 0 refills | Status: DC | PRN

## 2022-04-24 NOTE — Patient Instructions (Signed)
It was wonderful to meet you today. Thank you for allowing me to be a part of your care. Below is a short summary of what we discussed at your visit today:  Viral upper respiratory infection The good news is that this is a viral URI, or the common cold.  I do not see any evidence of pneumonia or ear infection that would need antibiotics, so no antibiotics today.  I recommend symptomatic treatment.  For you, we can do Tessalon Perles for cough and Flonase nasal spray to help with the nasal congestion.  Do not give these to your 72-year-old, as these are not safe for his age range.  Kids can quite easily overdose on the [facility], please keep these up and away from children at home.     Please bring all of your medications to every appointment!  If you have any questions or concerns, please do not hesitate to contact us via phone or MyChart message.   Ezequiel Essex, MD

## 2022-04-24 NOTE — Progress Notes (Signed)
    SUBJECTIVE:   CHIEF COMPLAINT / HPI:   Cough and congestion Ms. Demore presents today with her 25 yo boy for the same complaint of cough and congestion.   Patient reports she began to feel sick 1 week ago.  Symptoms include cough and runny nose.  Four others in the same household with similar symptoms (33-year-old son, sister, brother, mother) but they had symptoms starting 1 week prior to hers.  None of the sick contacts have undergone testing for flu or COVID.  Denies fever, nausea, vomiting, diarrhea, constipation, eye pain/redness/swelling, ear discomfort.  Eating and drinking somewhat normally.  OTC medications without much relief.  Works as [employer], does have [place] exposures.  Missed menses Patient also requests urine pregnancy test.  She is supposed to get a Nexplanon replacement soon as hers expired a month or so ago.  She reports no menses in about a month.  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   General: Awake, alert, no acute distress HEENT: TMs pearly pink and unremarkable, postnasal drip present, mild crusted rhinorrhea bilateral nares, moist oral mucosa with intact dentition, tonsils appear normal Lymph: No palpable lymphadenopathy Respiratory: CTAB, no wheezes or crackles Cardiac: Regular rate and rhythm, no murmurs  ASSESSMENT/PLAN:   Missed menses Urine pregnancy test negative.  Patient will call to schedule appointment for Nexplanon removal and replacement at her convenience.  Viral URI with cough 1 week duration, no red flags.  No evidence of bacterial infection such as pneumonia, bacterial conjunctivitis, or AOM.  No antibiotics needed at this time.  Using shared decision-making, patient  declines COVID or flu testing given lack of impact on management at this point.  Recommend symptomatic management.  Rx Tessalon Perle for cough, Flonase for nasal congestion.  Return precautions given.     Ezequiel Essex, MD [place]

## 2022-04-24 NOTE — Assessment & Plan Note (Signed)
Urine pregnancy test negative.  Patient will call to schedule appointment for Nexplanon removal and replacement at her convenience.

## 2022-04-24 NOTE — Assessment & Plan Note (Signed)
1 week duration, no red flags.  No evidence of bacterial infection such as pneumonia, bacterial conjunctivitis, or AOM.  No antibiotics needed at this time.  Using shared decision-making, patient declines COVID or flu testing given lack of impact on management at this point.  Recommend symptomatic management.  Rx Tessalon Perle for cough, Flonase for nasal congestion.  Return precautions given.

## 2022-04-25 ENCOUNTER — Encounter: Payer: Self-pay | Admitting: Family Medicine

## 2022-05-24 NOTE — Patient Instructions (Signed)
It was wonderful to see you today. Thank you for allowing me to be a part of your care. Below is a short summary of what we discussed at your visit today:  Physical exam Today we collected blood work to evaluate your hemoglobin, blood sugar level, electrolytes, kidney function, and cholesterol. If the results are normal, I will send you a letter or MyChart message. If the results are abnormal, I will give you a call.    STI screening - Today we obtained a vaginal swab to screen for gonorrhea, chlamydia, and trichomonas - We also obtained a blood sample to screen for HIV and syphilis - As above, if the results are normal, I will send you a letter or MyChart message. If the results are abnormal, I will give you a call.     Vaccines You are eligible for the flu and COVID vaccines. You can get those here.   Please bring all of your medications to every appointment!  If you have any questions or concerns, please do not hesitate to contact us via phone or MyChart message.   Fayette Pho, MD

## 2022-05-25 ENCOUNTER — Other Ambulatory Visit (HOSPITAL_COMMUNITY)
Admission: RE | Admit: 2022-05-25 | Discharge: 2022-05-25 | Disposition: A | Discharge status: Home / Self Care | Payer: Medicaid Other | Source: Ambulatory Visit | Attending: Family Medicine | Admitting: Family Medicine

## 2022-05-25 ENCOUNTER — Encounter: Payer: Self-pay | Admitting: Family Medicine

## 2022-05-25 ENCOUNTER — Ambulatory Visit (INDEPENDENT_AMBULATORY_CARE_PROVIDER_SITE_OTHER): Payer: Medicaid Other | Admitting: Family Medicine

## 2022-05-25 VITALS — BP 92/60 | HR 101 | Ht 68.0 in | Wt 140.0 lb

## 2022-05-25 DIAGNOSIS — N926 Irregular menstruation, unspecified: Secondary | ICD-10-CM

## 2022-05-25 DIAGNOSIS — Z131 Encounter for screening for diabetes mellitus: Secondary | ICD-10-CM

## 2022-05-25 DIAGNOSIS — Z113 Encounter for screening for infections with a predominantly sexual mode of transmission: Secondary | ICD-10-CM | POA: Diagnosis not present

## 2022-05-25 DIAGNOSIS — Z0000 Encounter for general adult medical examination without abnormal findings: Secondary | ICD-10-CM | POA: Diagnosis present

## 2022-05-25 DIAGNOSIS — Z13220 Encounter for screening for lipoid disorders: Secondary | ICD-10-CM

## 2022-05-25 DIAGNOSIS — D649 Anemia, unspecified: Secondary | ICD-10-CM | POA: Diagnosis not present

## 2022-05-25 NOTE — Progress Notes (Unsigned)
    SUBJECTIVE:   CHIEF COMPLAINT / HPI:   Physical exam and STI check No complaints at this time. Allergies, medications updated. Medical history reviewed. Chart updated.  -Concern for specific exposure? None -Current symptoms: None -Denies abnormal discharge, pain, dysuria -Contraception: not currently, but will start soon - nexplanon expired? 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    OBJECTIVE:   BP 92/60   Pulse (!) 101   Ht 5\' 8"  (1.727 m)   Wt 140 lb (63.5 kg)   LMP 04/19/2022   SpO2 98%   BMI 21.29 kg/m     PHQ-9:     05/25/2022   11:31 AM 10/11/2020   10:28 AM 03/12/2019   10:13 AM  Depression screen PHQ 2/9  Decreased Interest 1 1 0  Down, Depressed, Hopeless 1 1 0  PHQ - 2 Score 2 2 0  Altered sleeping 1 1   Tired, decreased energy 1 1   Change in appetite 0 1   Feeling bad or failure about yourself  1 0   Trouble concentrating 0 1   Moving slowly or fidgety/restless 0 0   Suicidal thoughts 0 0   PHQ-9 Score 5 6   Difficult doing work/chores  Not difficult at all     Physical Exam General: Awake, alert, oriented HEENT:sclera anicteric, thyroid unremarkable to palpation Lymph: No palpable lymphedema of head or neck Cardiovascular: Regular rate and rhythm, S1 and S2 present, no murmurs auscultated Respiratory: Lung fields clear to auscultation bilaterally Vulva: Normal appearing vulva without rashes, lesions, or deformities Vagina: Pale pink rugated vaginal  tissue without obvious lesions, physiologic discharge of whitish color, cervix without lesion or overt tenderness with swab  Sensitive exam performed with chaperone in the room:  4/9, CMA  ASSESSMENT/PLAN:   Routine physical examination Patient desires routine physical exam. No complaints at this time. Allergies and medications updated. Will obtain screening labs: BMP, CBC, A1c, lipids. Will follow results.   Routine screening for STI (sexually transmitted infection) Desires routine STI screening, no concern for specific exposure. Will collect vaginal swab for gonorrhea, chlamydia, and trichomonas; blood work for HIV and RPR. Will follow.     Maree Erie, MD [facility]

## 2022-05-26 LAB — CBC
Hematocrit: 40 % (ref 34.0–46.6)
Hemoglobin: 13 g/dL (ref 11.1–15.9)
MCH: 29.6 pg (ref 26.6–33.0)
MCHC: 32.5 g/dL (ref 31.5–35.7)
MCV: 91 fL (ref 79–97)
Platelets: 241 10*3/uL (ref 150–450)
RBC: 4.39 x10E6/uL (ref 3.77–5.28)
RDW: 12.1 % (ref 11.7–15.4)
WBC: 5.9 10*3/uL (ref 3.4–10.8)

## 2022-05-26 LAB — LIPID PANEL
Chol/HDL Ratio: 3.2 ratio (ref 0.0–4.4)
Cholesterol, Total: 127 mg/dL (ref 100–199)
HDL: 40 mg/dL (ref >39)
LDL Chol Calc (NIH): 79 mg/dL (ref 0–99)
Triglycerides: 31 mg/dL (ref 0–149)
VLDL Cholesterol Cal: 8 mg/dL (ref 5–40)

## 2022-05-26 LAB — HIV ANTIBODY (ROUTINE TESTING W REFLEX): HIV Screen 4th Generation wRfx: NONREACTIVE

## 2022-05-26 LAB — BASIC METABOLIC PANEL
BUN/Creatinine Ratio: 14 (ref 9–23)
BUN: 11 mg/dL (ref 6–20)
CO2: 24 mmol/L (ref 20–29)
Calcium: 9.4 mg/dL (ref 8.7–10.2)
Chloride: 104 mmol/L (ref 96–106)
Creatinine, Ser: 0.81 mg/dL (ref 0.57–1.00)
Glucose: 78 mg/dL (ref 70–99)
Potassium: 4.1 mmol/L (ref 3.5–5.2)
Sodium: 142 mmol/L (ref 134–144)
eGFR: 103 mL/min/{1.73_m2} (ref >59)

## 2022-05-26 LAB — HEMOGLOBIN A1C
Est. average glucose Bld gHb Est-mCnc: 108 mg/dL
Hgb A1c MFr Bld: 5.4 % (ref 4.8–5.6)

## 2022-05-26 LAB — RPR: RPR Ser Ql: NONREACTIVE

## 2022-05-27 DIAGNOSIS — Z0000 Encounter for general adult medical examination without abnormal findings: Secondary | ICD-10-CM | POA: Insufficient documentation

## 2022-05-27 DIAGNOSIS — Z113 Encounter for screening for infections with a predominantly sexual mode of transmission: Secondary | ICD-10-CM | POA: Insufficient documentation

## 2022-05-27 NOTE — Assessment & Plan Note (Signed)
Desires routine STI screening, no concern for specific exposure. Will collect vaginal swab for gonorrhea, chlamydia, and trichomonas; blood work for HIV and RPR. Will follow.

## 2022-05-27 NOTE — Assessment & Plan Note (Signed)
Patient desires routine physical exam. No complaints at this time. Allergies and medications updated. Will obtain screening labs: BMP, CBC, A1c, lipids. Will follow results.

## 2022-05-28 LAB — CERVICOVAGINAL ANCILLARY ONLY
Chlamydia: NEGATIVE
Comment: NEGATIVE
Comment: NEGATIVE
Comment: NORMAL
Neisseria Gonorrhea: NEGATIVE
Trichomonas: NEGATIVE

## 2022-08-29 ENCOUNTER — Ambulatory Visit: Payer: Medicaid Other | Admitting: Family Medicine

## 2022-08-29 NOTE — Progress Notes (Deleted)
    SUBJECTIVE:   CHIEF COMPLAINT / HPI:  No chief complaint on file.   ***  PERTINENT  PMH / PSH: ***  Patient Care Team: [name], MD as PCP - General (Family Medicine)   OBJECTIVE:   There were no vitals taken for this visit.  Physical Exam      05/25/2022   11:31 AM  Depression screen PHQ 2/9  Decreased Interest 1  Down, Depressed, Hopeless 1  PHQ - 2 Score 2  Altered sleeping 1  Tired, decreased energy 1  Change in appetite 0  Feeling bad or failure about yourself  1  Trouble concentrating 0  Moving slowly or fidgety/restless 0  Suicidal thoughts 0  PHQ-9 Score 5     {Show previous vital signs (optional):23777}  {Labs  Heme  Chem  Endocrine  Serology  Results Review (optional):23779}  ASSESSMENT/PLAN:   No problem-specific Assessment & Plan notes found for this encounter.    No follow-ups on file.   Zola Button, MD [place]

## 2022-09-06 ENCOUNTER — Ambulatory Visit (INDEPENDENT_AMBULATORY_CARE_PROVIDER_SITE_OTHER): Payer: Medicaid Other | Admitting: Student

## 2022-09-06 VITALS — BP 102/64 | HR 98 | Wt 146.0 lb

## 2022-09-06 DIAGNOSIS — Z975 Presence of (intrauterine) contraceptive device: Secondary | ICD-10-CM

## 2022-09-06 DIAGNOSIS — Z3046 Encounter for surveillance of implantable subdermal contraceptive: Secondary | ICD-10-CM | POA: Diagnosis not present

## 2022-09-06 DIAGNOSIS — Z30019 Encounter for initial prescription of contraceptives, unspecified: Secondary | ICD-10-CM

## 2022-09-06 LAB — POCT URINE PREGNANCY: Preg Test, Ur: NEGATIVE

## 2022-09-06 NOTE — Patient Instructions (Signed)
Nexplanon Instructions After Insertion  Keep bandage clean and dry for 24 hours  May use ice/Tylenol/Ibuprofen for soreness or pain  If you develop fever, drainage or increased warmth from incision site-contact office immediately  Use back-up protection with condom for up to 7 days after insertion to avoid unplanned pregnancy

## 2022-09-06 NOTE — Progress Notes (Signed)
GYNECOLOGY PROCEDURE NOTE  Stacey [name] is a 27 y.o. [id number] here for Nexplanon removal and insertion. Other methods of birth control were discussed as requested by patient.  She decided to ultimately stick with continuing Nexplanon.  No other gynecologic concerns.   Nexplanon Removal and Insertion  Patient identified, informed consent performed, consent signed.   Patient does understand that irregular bleeding is a very common side effect of this medication. She was advised to have backup contraception for one week after replacement of the implant. Pregnancy test in clinic today was negative.  Appropriate time out taken. Implanon site identified. Area prepped in usual sterile fashon. Two ml of 1% lidocaine was used to anesthetize the area at the distal end of the implant. A small stab incision was made right beside the implant on the distal portion. The Nexplanon rod was grasped using hemostats and removed without difficulty. There was minimal blood loss. There were no complications. A separate area was then injected with 3 ml of 1 % lidocaine. She was re-prepped with alcohol, Nexplanon removed from packaging, Device confirmed in needle, then inserted full length of needle and withdrawn per handbook instructions. Nexplanon was able to palpated in the patient's arm; patient palpated the insert herself.  There was minimal blood loss. Patient insertion site covered with guaze and a pressure bandage to reduce any bruising. The patient tolerated the procedure well and was given post procedure instructions.   Wells Guiles, DO 09/06/2022, 10:00 AM PGY-2, [place]

## 2022-09-13 MED ORDER — ETONOGESTREL 68 MG ~~LOC~~ IMPL
68.0000 mg | DRUG_IMPLANT | Freq: Once | SUBCUTANEOUS | Status: AC
  Administered 2022-09-06: 68 mg via SUBCUTANEOUS

## 2022-09-13 NOTE — Addendum Note (Signed)
Addended by: Talbot Grumbling on: 09/13/2022 12:59 PM   Modules accepted: Orders

## 2023-01-01 DIAGNOSIS — F900 Attention-deficit hyperactivity disorder, predominantly inattentive type: Secondary | ICD-10-CM | POA: Diagnosis not present

## 2023-01-01 DIAGNOSIS — F413 Other mixed anxiety disorders: Secondary | ICD-10-CM | POA: Diagnosis not present

## 2023-01-01 DIAGNOSIS — F4312 Post-traumatic stress disorder, chronic: Secondary | ICD-10-CM | POA: Diagnosis not present

## 2023-01-11 ENCOUNTER — Encounter: Payer: Self-pay | Admitting: Family Medicine

## 2023-01-11 ENCOUNTER — Other Ambulatory Visit (HOSPITAL_COMMUNITY)
Admission: RE | Admit: 2023-01-11 | Discharge: 2023-01-11 | Disposition: A | Discharge status: Home / Self Care | Payer: Medicaid Other | Source: Ambulatory Visit | Attending: Family Medicine | Admitting: Family Medicine

## 2023-01-11 ENCOUNTER — Telehealth: Payer: Self-pay | Admitting: Family Medicine

## 2023-01-11 ENCOUNTER — Ambulatory Visit: Payer: Medicaid Other | Admitting: Family Medicine

## 2023-01-11 VITALS — BP 98/65 | HR 78 | Ht 68.0 in | Wt 141.6 lb

## 2023-01-11 DIAGNOSIS — Z113 Encounter for screening for infections with a predominantly sexual mode of transmission: Secondary | ICD-10-CM | POA: Diagnosis not present

## 2023-01-11 DIAGNOSIS — N760 Acute vaginitis: Secondary | ICD-10-CM | POA: Insufficient documentation

## 2023-01-11 LAB — POCT WET PREP (WET MOUNT)
Clue Cells Wet Prep Whiff POC: POSITIVE
WBC, Wet Prep HPF POC: >20

## 2023-01-11 MED ORDER — METRONIDAZOLE 500 MG PO TABS: 500.0000 mg | ORAL_TABLET | Freq: Two times a day (BID) | ORAL | 0 refills | Status: AC

## 2023-01-11 NOTE — Telephone Encounter (Signed)
HIPAA compliant callback message left.  Please advise her that her wet prep is positive for BV and trich  I have sent A/B to her pharmacy. Please encourage her to have her partner tested and treated as well to avoid reinfection.   Dr. Lum Babe

## 2023-01-11 NOTE — Patient Instructions (Signed)
Psychiatry Resource List (Adults and Children) Most of these providers will take Medicaid. please consult your insurance for a complete and updated list of available providers. When calling to make an appointment have your insurance information available to confirm you are covered.   [facility]iatry and Counseling [address] [place], [place] [address] [phone]  [facility]  [address] [place], [place] [phone] Crisis [phone]   [facility]:   [place]: [address]     [phone]   [place]: [address],        [phone] [place]: [address],    [phone] [place]: [address],                   [phone] Children: [facility] [address]         [phone]  [facility] (virtual only) [phone]    [facility]  (Psychiatry only; Adults /children 12 and over, will take Medicaid)  [address], [place], [place] [address]       [phone]   [facility] (Psychiatry & counseling ; adults & children ; will take Medicaid [address]  [place] [address]  Go on-line to complete referral ( [email or web address] [phone]    (Spanish speaking therapists)  [facility]  Psychiatry & counseling; Adults and children;  Call Registration prior to scheduling an appointment [phone] [address]    [place], [place] [address]    [phone]  [facility] (Psychiatry & counseling; adults & children; Medicare no Medicaid)  [address]   [place], [place]  [address]      [phone]    [facility] (up to age 21)  Psychiatry & counseling ,will take Medicaid, must do counseling to receive psychiatry services  [address] [place]  [place] [address]        [phone]  [facility] (Psychiatry & counseling; adults & children; will take Medicaid) Will need a referral from provider [address],  [place], [place]  [phone]   [facility] --- Walk-In Mon-Friday 8am-3pm ( will take Medicaid, Psychiatry, Adults & children,  [address], [place], [place]   [phone]   [facility]--, Walk-in M-F 8am-12pm and 1pm -3pm   (Counseling, Psychiatry, will take Medicaid, adults & children)  [address], [place], [place]  [phone]   

## 2023-01-11 NOTE — Progress Notes (Signed)
    SUBJECTIVE:   CHIEF COMPLAINT / HPI:   Vaginitis/STD screening: C/O fishy vaginal discharge with irritation x 3 days. She was sexually active three days ago with her on-and-off partner. She believes he has other sexual partners. She would like to get tested for STDs. She is on Nexplanon for birth control and still gets her periods. The most recent period was left over a month ago with intermittent vaginal spotting. She denies any abdominal pain.  PERTINENT  PMH / PSH: PMHx reviewed  OBJECTIVE:   BP 98/65   Pulse 78   Ht 5\' 8"  (1.727 m)   Wt 141 lb 9.6 oz (64.2 kg)   LMP 12/18/2022   SpO2 100%   BMI 21.53 kg/m   Physical Exam Vitals and nursing note reviewed. Exam conducted with a chaperone present Cleatrice Burke).  Cardiovascular:     Rate and Rhythm: Normal rate and regular rhythm.     Heart sounds: Normal heart sounds. No murmur heard. Pulmonary:     Effort: Pulmonary effort is normal. No respiratory distress.     Breath sounds: Normal breath sounds. No wheezing.  Abdominal:     General: Abdomen is flat. Bowel sounds are normal. There is no distension.     Palpations: Abdomen is soft. There is no mass.     Tenderness: There is no abdominal tenderness.  Genitourinary:    Vagina: Vaginal discharge present.     Cervix: Discharge present.     Comments: Milky vaginal discharge     ASSESSMENT/PLAN:  Vaginitis: Wet prep positive for Trich and BV I attempted to call her with the result but left a callback message I also sent her a MyChart report Flagyl escribed to the pharmacy Need partner treatment as well.  STD screening: Positive Trich on Wet prep Treatment as above HIV, RPR, GC and Chlamydia tested today I discussed HIV PrEP therapy with her, and she is interested in this Hepatitis panel and Cmet checked Plan to check upreg before initiating PrEP regardless of her contraception status We will await test results and return to lab for upreg prior to  initiating PrEP She agreed with the plan  Janit Pagan, MD [facility]

## 2023-01-14 ENCOUNTER — Telehealth: Payer: Self-pay | Admitting: Family Medicine

## 2023-01-14 MED ORDER — DOXYCYCLINE HYCLATE 100 MG PO TABS: 100.0000 mg | ORAL_TABLET | Freq: Two times a day (BID) | ORAL | 0 refills | Status: AC

## 2023-01-14 NOTE — Telephone Encounter (Signed)
+  Chlamydia discussed with her. She already started treatment for Trich and BV. Doxy escribed. I advised her to inform her partner to get tested and treated as well. I discussed HIV PrEP initiation. She request an appointment to discuss this further as well as discuss getting off birth control altogether.  I als advised her about need for Hep B revaccination based on her test results. We'll discuss at her next visit.  Schedule test of cure for chlamydia in 3 months.  Messaged RN team to report positive test to the HD.

## 2023-01-14 NOTE — Telephone Encounter (Signed)
Faxed paperwork to the Health Department.   Veronda Prude, RN

## 2023-01-23 ENCOUNTER — Encounter: Payer: Self-pay | Admitting: Family Medicine

## 2023-01-25 ENCOUNTER — Ambulatory Visit: Payer: Medicaid Other | Admitting: Family Medicine

## 2023-05-09 ENCOUNTER — Other Ambulatory Visit (HOSPITAL_COMMUNITY)
Admission: RE | Admit: 2023-05-09 | Discharge: 2023-05-09 | Disposition: A | Discharge status: Home / Self Care | Payer: Medicaid Other | Source: Ambulatory Visit | Attending: Family Medicine | Admitting: Family Medicine

## 2023-05-09 ENCOUNTER — Encounter: Payer: Self-pay | Admitting: Student

## 2023-05-09 ENCOUNTER — Ambulatory Visit (INDEPENDENT_AMBULATORY_CARE_PROVIDER_SITE_OTHER): Payer: Medicaid Other | Admitting: Student

## 2023-05-09 VITALS — BP 98/70 | HR 82 | Ht 68.0 in | Wt 144.8 lb

## 2023-05-09 DIAGNOSIS — Z202 Contact with and (suspected) exposure to infections with a predominantly sexual mode of transmission: Secondary | ICD-10-CM

## 2023-05-09 DIAGNOSIS — Z113 Encounter for screening for infections with a predominantly sexual mode of transmission: Secondary | ICD-10-CM | POA: Insufficient documentation

## 2023-05-09 DIAGNOSIS — Z111 Encounter for screening for respiratory tuberculosis: Secondary | ICD-10-CM | POA: Diagnosis not present

## 2023-05-09 NOTE — Patient Instructions (Signed)
  Estuvo muy bien verte! Gracias por permitirme participar en su cuidado!   Nuestros planes para hoy: - Utilice crema de permetrina al 5%. - Por favor acuda a su cita con dermatologa.   Tenga cuidado y busque atencin inmediata lo antes posible si tiene alguna inquietud. Recuerde presentarse 15 minutos antes de la hora programada para su cita!  Martin Bronson, DO [facility]  

## 2023-05-09 NOTE — Progress Notes (Signed)
    SUBJECTIVE:   CHIEF COMPLAINT / HPI:   Exposure to sexually transmitted disease Patient reported exposure to sick recently disease.  Is requesting swabs and blood work.  She does not want a female provider, however no female provider available for swab-patient instructed on self swab.  She denies any symptoms.  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  Patient insist on screening for HSV.  Discussed HSV can be present and does not necessarily mean infidelity with her partner, nor would this change her treatment without lesions.  Need for TB testing Patient reports that she needs TB testing.  No symptoms.  OBJECTIVE:   BP 98/70   Pulse 82   Ht 5\' 8"  (1.727 m)   Wt 144 lb 12.8 oz (65.7 kg)   SpO2 100%   BMI 22.02 kg/m    General: NAD, pleasant Cardio: RRR, no MRG. Cap Refill <2s. Respiratory: CTAB, normal wob on RA GI: Abdomen is soft, not tender, not distended. BS present Skin: Warm and dry  ASSESSMENT/PLAN:   Assessment & Plan Exposure to sexually transmitted disease (STD) No symptoms, reports exposure. - GC chlamydia, trichomonas swabs - RPR, HIV, HSV 1-2 IgG Screening-pulmonary TB -QuantiFERON gold   Tiffany Kocher, DO [facility]

## 2023-05-10 ENCOUNTER — Telehealth: Payer: Self-pay | Admitting: Student

## 2023-05-10 DIAGNOSIS — B009 Herpesviral infection, unspecified: Secondary | ICD-10-CM | POA: Insufficient documentation

## 2023-05-10 LAB — CERVICOVAGINAL ANCILLARY ONLY
Chlamydia: NEGATIVE
Comment: NEGATIVE
Comment: NEGATIVE
Comment: NORMAL
Neisseria Gonorrhea: NEGATIVE
Trichomonas: NEGATIVE

## 2023-05-10 NOTE — Telephone Encounter (Signed)
Called patient, confirmed identity.  Discussed positive HSV 2 results.  Patient denies any active lesions, nor has she had lesions before.  Discussed the natural history of HSV-2.  Discussed that this will be important to note when she is pregnant.  Discussed treatment occurs during flares, counseled on symptoms of flares.  Discussed there is no cure.  Discussed discussed use of protection and avoidance of intercourse during active flares.  Discussed transmission can still occur without lesions, however it is a relatively benign disease for most adults.  Answered patient questions.  Patient expressed understanding agreement with plan.

## 2023-05-13 LAB — RPR: RPR Ser Ql: NONREACTIVE

## 2023-05-13 LAB — HSV-2 AB, IGG: HSV 2 IgG, Type Spec: REACTIVE — AB

## 2023-05-13 LAB — QUANTIFERON-TB GOLD PLUS
QuantiFERON Mitogen Value: >10 [IU]/mL
QuantiFERON Nil Value: 0.04 [IU]/mL
QuantiFERON TB1 Ag Value: 0.05 [IU]/mL
QuantiFERON TB2 Ag Value: 0.07 [IU]/mL
QuantiFERON-TB Gold Plus: NEGATIVE

## 2023-05-13 LAB — HIV ANTIBODY (ROUTINE TESTING W REFLEX): HIV Screen 4th Generation wRfx: NONREACTIVE

## 2023-05-13 LAB — HSV 1 ANTIBODY, IGG: HSV 1 Glycoprotein G Ab, IgG: NONREACTIVE

## 2023-05-14 ENCOUNTER — Telehealth: Payer: Self-pay | Admitting: Family Medicine

## 2023-05-14 NOTE — Telephone Encounter (Signed)
Left voicemail for patient regarding results. Unclear if she is having HSV outbreak given HSV testing therefore would ask if she returns a call to the clinic. If having outbreak, Valacyclovir clinically indicated. Otherwise negative for G/C, trich, syphilis and HIV.   TB testing negative.  Elberta Fortis, DO

## 2023-06-11 DIAGNOSIS — F900 Attention-deficit hyperactivity disorder, predominantly inattentive type: Secondary | ICD-10-CM | POA: Diagnosis not present

## 2023-06-11 DIAGNOSIS — F4312 Post-traumatic stress disorder, chronic: Secondary | ICD-10-CM | POA: Diagnosis not present

## 2023-06-11 DIAGNOSIS — F413 Other mixed anxiety disorders: Secondary | ICD-10-CM | POA: Diagnosis not present

## 2023-07-10 ENCOUNTER — Telehealth: Payer: Medicaid Other | Admitting: Family Medicine

## 2023-07-10 DIAGNOSIS — R051 Acute cough: Secondary | ICD-10-CM | POA: Diagnosis present

## 2023-07-10 NOTE — Progress Notes (Cosign Needed Addendum)
    SUBJECTIVE:   CHIEF COMPLAINT / HPI:   CM is a 28yo F w/ hx of allergic rhinitis that p/f flu exposure.  This visit was held over video call. - Family member had flu about a week ago. - Pt has been having runny nose and congestion for past few days. - Also has a cough. - Decreased appetite, but still toelrating PO - Denies fever, diarrhea - Gives motrin when he feels warm or has high HR.  - Symptoms started on 07/01/23, reports feeling better and continuing to improve but needs work note.  OBJECTIVE:   There were no vitals taken for this visit.  General: Alert, well-appearing woman speaking in full sentences on room air.  NAD. HEENT: MMM Resp: Normal work of breathing on room air   ASSESSMENT/PLAN:   Assessment & Plan Acute cough History is most congruent with viral URI, appears to be close to baseline now.  Will provide school note for patient.  Counseled on supportive management and return precautions.    Lincoln Brigham, MD [facility]

## 2023-07-11 ENCOUNTER — Telehealth: Payer: Self-pay

## 2023-07-11 ENCOUNTER — Encounter: Payer: Self-pay | Admitting: Family Medicine

## 2023-07-11 NOTE — Telephone Encounter (Signed)
Patient calls nurse line requesting a work note.   She reports she had a video visit with Sherrilee Gilles yesterday.   She reports her note needs to excuse her from 1/20-1/23.  She reports she plans to go to work as scheduled tomorrow.   Advised will forward to provider.

## 2023-12-04 ENCOUNTER — Ambulatory Visit (HOSPITAL_COMMUNITY)
Admission: EM | Admit: 2023-12-04 | Discharge: 2023-12-04 | Disposition: A | Discharge status: Home / Self Care | Attending: Family Medicine | Admitting: Family Medicine

## 2023-12-04 ENCOUNTER — Encounter (HOSPITAL_COMMUNITY): Payer: Self-pay

## 2023-12-04 DIAGNOSIS — K529 Noninfective gastroenteritis and colitis, unspecified: Secondary | ICD-10-CM

## 2023-12-04 LAB — POCT URINALYSIS DIP (MANUAL ENTRY)
Bilirubin, UA: NEGATIVE
Blood, UA: NEGATIVE
Glucose, UA: NEGATIVE mg/dL
Ketones, POC UA: NEGATIVE mg/dL
Nitrite, UA: NEGATIVE
Protein Ur, POC: NEGATIVE mg/dL
Spec Grav, UA: >=1.03 — AB (ref 1.010–1.025)
Urobilinogen, UA: 0.2 U/dL
pH, UA: 6.5 (ref 5.0–8.0)

## 2023-12-04 LAB — POCT URINE PREGNANCY: Preg Test, Ur: NEGATIVE

## 2023-12-04 MED ORDER — ONDANSETRON 4 MG PO TBDP: 4.0000 mg | ORAL_TABLET | Freq: Three times a day (TID) | ORAL | 0 refills | Status: AC | PRN

## 2023-12-04 NOTE — Discharge Instructions (Signed)
Please do your best to ensure adequate fluid intake in order to avoid dehydration. If you find that you are unable to tolerate drinking fluids regularly please proceed to the Emergency Department for evaluation. ° °Also, you should return to the hospital if you experience persistent fevers for greater than 1-2 more days, increasing abdominal pain that persists despite medications, persistent diarrhea, dizziness, syncope (fainting), or for any other concerns you may deem worrisome. °

## 2023-12-04 NOTE — ED Triage Notes (Signed)
 Patient presents with vomiting and diarrhea x 3 days. Patient reports some reflux symptoms.

## 2023-12-05 NOTE — ED Provider Notes (Signed)
 [facility]   [id number] 12/04/23 Arrival Time: 1921  ASSESSMENT & PLAN:  1. Gastroenteritis    Tolerating PO fluids. If needed: Meds ordered this encounter  Medications   ondansetron  (ZOFRAN -ODT) 4 MG disintegrating tablet    Sig: Take 1 tablet (4 mg total) by mouth every 8 (eight) hours as needed for nausea or vomiting.    Dispense:  15 tablet    Refill:  0   Hopefully will improve over next 24-48 hours. Benign abdomen. Discussed typical duration of symptoms for suspected viral GI illness. Will do her best to ensure adequate fluid intake in order to avoid dehydration. Will proceed to the Emergency Department for evaluation if unable to tolerate PO fluids regularly.  Otherwise she will f/u with her PCP or here if not showing improvement over the next 48-72 hours.  Reviewed expectations re: course of current medical issues. Questions answered. Outlined signs and symptoms indicating need for more acute intervention. Patient verbalized understanding. After Visit Summary given.   SUBJECTIVE: History from: patient.  Stacey [name] is a 28 y.o. female who presents with complaint of non-bilious, non-bloody intermittent n/v with non-bloody loose stools. Onset approx 48 hours ago. Abdominal discomfort: none and cramping. Symptoms are gradually improving since beginning. Aggravating factors: eating. Alleviating factors: none. Associated symptoms: mild fatigue. She denies fever. Appetite: decreased. PO intake: decreased. Ambulatory without assistance. Urinary symptoms: none. Sick contacts: none. Recent travel or camping: none. OTC treatment: none.  Patient's last menstrual period was 10/31/2023 (approximate).  Past Surgical History:  Procedure Laterality Date   NO PAST SURGERIES      ROS: As per HPI.  OBJECTIVE:  Vitals:   12/04/23 1941  BP: 93/67  Pulse: 74  Resp: 16  Temp: 98.5 F (36.9 C)  TempSrc: Oral  SpO2: 96%    General appearance: alert; no  distress Oropharynx: slightly dry Lungs: clear to auscultation bilaterally; unlabored Heart: regular rate and rhythm Abdomen: soft; non-distended; no significant abdominal tenderness; bowel sounds present; no masses or organomegaly; no guarding or rebound tenderness Back: no CVA tenderness Extremities: no edema; symmetrical with no gross deformities Skin: warm; dry Neurologic: normal gait Psychological: alert and cooperative; normal mood and affect  Labs: Results for orders placed or performed during the hospital encounter of 12/04/23  POC urinalysis dipstick   Collection Time: 12/04/23  7:57 PM  Result Value Ref Range   Color, UA yellow yellow   Clarity, UA clear clear   Glucose, UA negative negative mg/dL   Bilirubin, UA negative negative   Ketones, POC UA negative negative mg/dL   Spec Grav, UA >=4.742 (A) 1.010 - 1.025   Blood, UA negative negative   pH, UA 6.5 5.0 - 8.0   Protein Ur, POC negative negative mg/dL   Urobilinogen, UA 0.2 0.2 or 1.0 E.U./dL   Nitrite, UA Negative Negative   Leukocytes, UA Trace (A) Negative  POCT urine pregnancy   Collection Time: 12/04/23  7:57 PM  Result Value Ref Range   Preg Test, Ur Negative Negative   Labs Reviewed  POCT URINALYSIS DIP (MANUAL ENTRY) - Abnormal; Notable for the following components:      Result Value   Spec Grav, UA >=1.030 (*)    Leukocytes, UA Trace (*)    All other components within normal limits  POCT URINE PREGNANCY    Imaging: No results found.  Allergies  Allergen Reactions   Penicillins Other (See Comments)    Has patient had a PCN reaction causing immediate rash,  facial/tongue/throat swelling, SOB or lightheadedness with hypotension: No Has patient had a PCN reaction causing severe rash involving mucus membranes or skin necrosis: No Has patient had a PCN reaction that required hospitalization No Has pt had a PCN reaction in the last 10 years? No. Nearly universal familial allergy. Pt has never taken  PCN.                                                Past Medical History:  Diagnosis Date   ADHD    ADHD (attention deficit hyperactivity disorder)    Depression    Low weight gain in pregnancy  01/01/2019   7/16- Has gained 3 pounds since last visit. 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)    Social History   Socioeconomic History   Marital status: Single    Spouse name: Not on file   Number of children: Not on file   Years of education: Not on file   Highest education level: Not on file  Occupational History   Not on file  Tobacco Use   Smoking status: Former    Passive exposure: Past   Smokeless tobacco: Never  Vaping Use   Vaping status: Every Day  Substance and Sexual Activity   Alcohol use: No    Alcohol/week: 0.0 standard drinks of alcohol   Drug use: Not Currently    Types: Marijuana    Comment: stopped @ 4months of pregnancy   Sexual activity: Not Currently    Birth control/protection: OCP  Other Topics Concern   Not on file  Social History Narrative   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), 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, 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   Social Drivers of Corporate investment banker Strain: Not on file  Food Insecurity: Not on file  Transportation Needs: Not on file  Physical Activity: Not on file  Stress: Not on file  Social Connections: Not on file  Intimate Partner Violence: Not on file   Family History  Problem Relation Age of Onset   Depression Mother        bipolar, ptsd   ADD / ADHD Mother    Depression Father        depression,ptsd   ADD / ADHD  Father    ADD / ADHD Sister    ADD / ADHD Brother    Diabetes Paternal Grandmother    Diabetes Paternal Grandfather    Hyperlipidemia Paternal Ashby Lawman, MD 12/05/23 1147

## 2023-12-14 ENCOUNTER — Encounter (HOSPITAL_COMMUNITY): Payer: Self-pay

## 2023-12-14 ENCOUNTER — Ambulatory Visit (HOSPITAL_COMMUNITY)
Admission: EM | Admit: 2023-12-14 | Discharge: 2023-12-14 | Disposition: A | Discharge status: Home / Self Care | Attending: Nurse Practitioner | Admitting: Nurse Practitioner

## 2023-12-14 DIAGNOSIS — W57XXXA Bitten or stung by nonvenomous insect and other nonvenomous arthropods, initial encounter: Secondary | ICD-10-CM

## 2023-12-14 DIAGNOSIS — S60464A Insect bite (nonvenomous) of right ring finger, initial encounter: Secondary | ICD-10-CM

## 2023-12-14 MED ORDER — SULFAMETHOXAZOLE-TRIMETHOPRIM 800-160 MG PO TABS: 1.0000 | ORAL_TABLET | Freq: Two times a day (BID) | ORAL | 0 refills | Status: AC

## 2023-12-14 NOTE — ED Provider Notes (Signed)
 [facility]    CSN: [id number] Arrival date & time: 12/14/23  1628      History   Chief Complaint Chief Complaint  Patient presents with   Insect Bite    HPI Stacey [name] is a 28 y.o. female.   Discussed the use of AI scribe software for clinical note transcription with the patient, who gave verbal consent to proceed.   28 yo female that presents with a complaint of a sharp pain in their right ring finger that occurred approximately 2 hours ago while they were outside. The patient reports feeling a sudden sharp pain but did not see what caused it. The affected area is now swollen and numb, with the patient stating they can't really feel it. They noticed very little evidence of a bite mark on the finger. The onset of symptoms was acute, occurring about 2 hours prior to the visit when the sun was out. The patient was outside at the time of the incident. They describe the initial sensation as a sharp pain, followed by swelling and numbness. The patient reports no significant pain at present. They express concern about the rapid onset of swelling, stating, That's crazy how quickly it swell up.   The following portions of the patient's history were reviewed and updated as appropriate: allergies, current medications, past family history, past medical history, past social history, past surgical history, and problem list.     Past Medical History:  Diagnosis Date   ADHD    ADHD (attention deficit hyperactivity disorder)    Depression    Low weight gain in pregnancy  01/01/2019   7/16- Has gained 3 pounds since last visit. 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 Authorizing Provider  escitalopram (LEXAPRO) 5 MG tablet Take 5 mg by mouth daily.   Yes [provider]  ondansetron  (ZOFRAN -ODT) 4 MG disintegrating tablet Take 1 tablet (4 mg total) by mouth every 8 (eight) hours as needed for nausea or vomiting. 12/04/23  Yes Hagler, Redell, MD  sulfamethoxazole -trimethoprim  (BACTRIM  DS) 800-160 MG tablet Take 1 tablet by mouth 2 (two) times daily for 7 days. 12/14/23 12/21/23 Yes Iola Lukes, FNP  fluticasone  (FLONASE ) 50 MCG/ACT nasal spray Place  2 sprays into both nostrils at bedtime. Insert spray nozzle into nose and aim for ear. 04/24/22   Macario Dorothyann HERO, MD  lisdexamfetamine (VYVANSE) 10 MG capsule Take 10 mg by mouth daily.    [provider]  UNABLE TO FIND Med Name: pt reports having birth control implant.    [provider]    Family History Family History  Problem Relation Age of Onset   Depression Mother        bipolar, ptsd   ADD / ADHD Mother    Depression Father        depression,ptsd   ADD / ADHD Father    ADD / ADHD Sister    ADD / ADHD Brother    Diabetes Paternal Grandmother    Diabetes Paternal Grandfather    Hyperlipidemia Paternal Grandfather     Social History Social History   Tobacco Use   Smoking status: Former    Passive exposure: Past    Smokeless tobacco: Never  Vaping Use   Vaping status: Every Day  Substance Use Topics   Alcohol use: No    Alcohol/week: 0.0 standard drinks of alcohol   Drug use: Not Currently    Types: Marijuana    Comment: stopped @ 4months of pregnancy     Allergies   Penicillins   Review of Systems Review of Systems  Skin:  Positive for wound.  Neurological:  Positive for numbness.  All other systems reviewed and are negative.    Physical Exam Triage Vital Signs ED Triage Vitals  Encounter Vitals Group     BP 12/14/23 1642 95/62     Girls Systolic BP Percentile --      Girls Diastolic BP Percentile --      Boys Systolic BP Percentile --      Boys Diastolic BP Percentile --      Pulse Rate 12/14/23 1642 81     Resp 12/14/23 1642 18     Temp --      Temp src --      SpO2 12/14/23 1642 98 %     Weight --      Height --      Head Circumference --      Peak Flow --      Pain Score 12/14/23 1644 0     Pain Loc --      Pain Education --      Exclude from Growth Chart --    No data found.  Updated Vital Signs BP 95/62 (BP Location: Left Arm)   Pulse 81   Resp 18   LMP 10/31/2023 (Approximate)   SpO2 98%   Visual Acuity Right Eye Distance:   Left Eye Distance:   Bilateral Distance:    Right Eye Near:   Left Eye Near:    Bilateral Near:     Physical Exam Vitals reviewed.  Constitutional:      General: She is not in acute distress.    Appearance: Normal appearance. She is not toxic-appearing.  HENT:     Head: Normocephalic.     Mouth/Throat:     Mouth: Mucous membranes are moist.   Eyes:     Conjunctiva/sclera: Conjunctivae normal.    Cardiovascular:     Rate and Rhythm: Normal rate and regular rhythm.     Heart sounds: Normal heart sounds.  Pulmonary:     Effort: Pulmonary effort is normal.     Breath sounds: Normal breath sounds.   Musculoskeletal:  General: Normal range of motion.   Skin:    General: Skin is warm and dry.     Comments:  swelling of the right ring finger, most prominent at the proximal interphalangeal joint. The area is mildly warm to touch without erythema. No visible break in the skin or puncture mark is noted. Full range of motion, intact strength and sensation. Neurovascular status is intact.   Neurological:     General: No focal deficit present.     Mental Status: She is alert and oriented to person, place, and time.      UC Treatments / Results  Labs (all labs ordered are listed, but only abnormal results are displayed) Labs Reviewed - No data to display  EKG   Radiology No results found.  Procedures Procedures (including critical care time)  Medications Ordered in UC Medications - No data to display  Initial Impression / Assessment and Plan / UC Course  I have reviewed the triage vital signs and the nursing notes.  Pertinent labs & imaging results that were available during my care of the patient were reviewed by me and considered in my medical decision making (see chart for details).     Patient presents with a 2-hour history of sharp pain, swelling, and numbness in the right ring finger following outdoor exposure, with suspected insect bite as the likely cause. Examination reveals significant swelling with minimal visible bite mark, located near a joint, raising concern for early localized infection or inflammatory reaction. Due to the rapid progression and involvement of a joint, patient was prescribed Bactrim  twice daily for 7 days for empiric coverage. Ice application to the area was recommended, and patient was advised to monitor symptoms closely. ED precautions were reviewed, including return for worsening pain, increasing swelling, spreading redness, fever, or decreased finger mobility or sensation.  Today's evaluation has revealed no signs of a dangerous process. Discussed diagnosis with patient and/or guardian. Patient and/or guardian aware of their diagnosis, possible red flag  symptoms to watch out for and need for close follow up. Patient and/or guardian understands verbal and written discharge instructions. Patient and/or guardian comfortable with plan and disposition.  Patient and/or guardian has a clear mental status at this time, good insight into illness (after discussion and teaching) and has clear judgment to make decisions regarding their care  Documentation was completed with the aid of voice recognition software. Transcription may contain typographical errors.  Final Clinical Impressions(s) / UC Diagnoses   Final diagnoses:  Insect bite of right ring finger, initial encounter     Discharge Instructions      You were seen today for pain, swelling, and numbness in your right ring finger that started shortly after being outside. While the exact cause is unclear, your symptoms may be related to an insect bite or early infection. The swelling developed quickly and involves a joint, so you were prescribed Bactrim  to take twice a day for 7 days to help prevent or treat a possible infection.  Apply ice to the affected finger for 15-20 minutes at a time, several times a day, to reduce swelling and discomfort. Keep your hand elevated when possible to help with swelling. Avoid squeezing or manipulating the area. Take your antibiotic exactly as prescribed and do not stop early, even if symptoms begin to improve.  If you notice worsening pain, increased redness, spreading swelling, pus or drainage from the site, fever, or if you are unable to move the finger or feel increasing numbness, go  to the emergency room. Follow up with your primary care provider if symptoms do not begin to improve within 2-3 days or if you have concerns about how the finger is healing.      ED Prescriptions     Medication Sig Dispense Auth. Provider   sulfamethoxazole -trimethoprim  (BACTRIM  DS) 800-160 MG tablet Take 1 tablet by mouth 2 (two) times daily for 7 days. 14 tablet Iola Lukes, FNP      PDMP not reviewed this encounter.   Iola Lukes, [place] 12/14/23 [phone]

## 2023-12-14 NOTE — Discharge Instructions (Addendum)
 You were seen today for pain, swelling, and numbness in your right ring finger that started shortly after being outside. While the exact cause is unclear, your symptoms may be related to an insect bite or early infection. The swelling developed quickly and involves a joint, so you were prescribed Bactrim  to take twice a day for 7 days to help prevent or treat a possible infection.  Apply ice to the affected finger for 15-20 minutes at a time, several times a day, to reduce swelling and discomfort. Keep your hand elevated when possible to help with swelling. Avoid squeezing or manipulating the area. Take your antibiotic exactly as prescribed and do not stop early, even if symptoms begin to improve.  If you notice worsening pain, increased redness, spreading swelling, pus or drainage from the site, fever, or if you are unable to move the finger or feel increasing numbness, go to the emergency room. Follow up with your primary care provider if symptoms do not begin to improve within 2-3 days or if you have concerns about how the finger is healing.

## 2023-12-14 NOTE — ED Triage Notes (Signed)
 Insect bite to her R-middle finger. Patient reports swelling and redness.

## 2023-12-16 ENCOUNTER — Telehealth: Payer: Self-pay

## 2023-12-16 ENCOUNTER — Telehealth: Payer: Self-pay | Admitting: Family Medicine

## 2023-12-16 NOTE — Telephone Encounter (Signed)
 Stacey [name] was drawn in 04/2023.  Printed results and placed up front for patient.   Attempted to call her, however had to LVM.

## 2023-12-16 NOTE — Telephone Encounter (Signed)
 Contacted the patient to provide her information about her concern but the patient did not answer. Phone went schedule do voicemail. Unable to leave voicemail.

## 2024-06-05 ENCOUNTER — Ambulatory Visit: Admitting: Family Medicine

## 2024-06-07 ENCOUNTER — Emergency Department (HOSPITAL_COMMUNITY)
Admission: EM | Admit: 2024-06-07 | Discharge: 2024-06-07 | Discharge status: Against Medical Advice | Source: Home / Self Care

## 2024-06-07 ENCOUNTER — Encounter (HOSPITAL_COMMUNITY): Payer: Self-pay

## 2024-06-07 ENCOUNTER — Other Ambulatory Visit: Payer: Self-pay

## 2024-06-07 DIAGNOSIS — Y9289 Other specified places as the place of occurrence of the external cause: Secondary | ICD-10-CM | POA: Insufficient documentation

## 2024-06-07 DIAGNOSIS — R197 Diarrhea, unspecified: Secondary | ICD-10-CM | POA: Diagnosis not present

## 2024-06-07 DIAGNOSIS — R0981 Nasal congestion: Secondary | ICD-10-CM | POA: Diagnosis not present

## 2024-06-07 DIAGNOSIS — Z5321 Procedure and treatment not carried out due to patient leaving prior to being seen by health care provider: Secondary | ICD-10-CM | POA: Insufficient documentation

## 2024-06-07 DIAGNOSIS — R1110 Vomiting, unspecified: Secondary | ICD-10-CM | POA: Diagnosis not present

## 2024-06-07 DIAGNOSIS — R059 Cough, unspecified: Secondary | ICD-10-CM | POA: Diagnosis present

## 2024-06-07 LAB — CBC
HCT: 44.6 % (ref 36.0–46.0)
Hemoglobin: 14.5 g/dL (ref 12.0–15.0)
MCH: 30.1 pg (ref 26.0–34.0)
MCHC: 32.5 g/dL (ref 30.0–36.0)
MCV: 92.7 fL (ref 80.0–100.0)
Platelets: 237 K/uL (ref 150–400)
RBC: 4.81 MIL/uL (ref 3.87–5.11)
RDW: 13.3 % (ref 11.5–15.5)
WBC: 13.4 K/uL — ABNORMAL HIGH (ref 4.0–10.5)
nRBC: 0 % (ref 0.0–0.2)

## 2024-06-07 LAB — BASIC METABOLIC PANEL WITH GFR
Anion gap: 13 (ref 5–15)
BUN: 10 mg/dL (ref 6–20)
CO2: 21 mmol/L — ABNORMAL LOW (ref 22–32)
Calcium: 9.1 mg/dL (ref 8.9–10.3)
Chloride: 105 mmol/L (ref 98–111)
Creatinine, Ser: 0.6 mg/dL (ref 0.44–1.00)
GFR, Estimated: >60 mL/min
Glucose, Bld: 87 mg/dL (ref 70–99)
Potassium: 3.5 mmol/L (ref 3.5–5.1)
Sodium: 138 mmol/L (ref 135–145)

## 2024-06-07 LAB — TROPONIN T, HIGH SENSITIVITY: Troponin T High Sensitivity: <15 ng/L (ref 0–19)

## 2024-06-07 LAB — RESP PANEL BY RT-PCR (RSV, FLU A&B, COVID)  RVPGX2
Influenza A by PCR: NEGATIVE
Influenza B by PCR: NEGATIVE
Resp Syncytial Virus by PCR: NEGATIVE
SARS Coronavirus 2 by RT PCR: NEGATIVE

## 2024-06-07 LAB — HCG, SERUM, QUALITATIVE: Preg, Serum: NEGATIVE

## 2024-06-07 MED ORDER — ONDANSETRON HCL 4 MG/2ML IJ SOLN
4.0000 mg | Freq: Once | INTRAMUSCULAR | Status: AC
  Administered 2024-06-07: 4 mg via INTRAVENOUS
  Filled 2024-06-07: qty 2

## 2024-06-07 NOTE — ED Triage Notes (Signed)
 Pt states she is having an allergic reaction to chocolate, presents with N&V, chest tightness, body aches, and SOB. Pt states she has allergic reactions to chocolate in the past just not this bad. Pt states she had the chocolate Last night.

## 2024-06-07 NOTE — ED Provider Triage Note (Signed)
 Emergency Medicine Provider Triage Evaluation Note  Stacey [name] , a 28 y.o. female  was evaluated in triage.  Pt complains of cough, congestion, vomiting and diarrhea since yesterday.  Patient is lactose intolerant and had ice cream.  Also states that she is allergic to chocolate that was directly in the ice cream.  Review of Systems  Positive:  Negative:   Physical Exam  BP 121/81 (BP Location: Left Arm)   Pulse (!) 101   Temp 99.3 F (37.4 C) (Oral)   Resp 16   Ht 5' 8 (1.727 m)   Wt 58.1 kg   SpO2 100%   BMI 19.46 kg/m  Gen:   Awake, no distress   Resp:  Normal effort  MSK:   Moves extremities without difficulty  Other:  No focal abdominal tenderness, airways patent, no angioedema  Medical Decision Making  Medically screening exam initiated at 4:41 PM.  Appropriate orders placed.  Stacey [name] was informed that the remainder of the evaluation will be completed by another provider, this initial triage assessment does not replace that evaluation, and the importance of remaining in the ED until their evaluation is complete.     Donnajean Lynwood DEL, PA-C 06/07/24 1642

## 2024-06-07 NOTE — ED Notes (Signed)
 Patient was upset for moving her to lobby. RN explained the Dr. Was seen you and they did not order any pain medication for you. Patient start screaming to RN and NT. Charge RN informed.

## 2024-06-07 NOTE — ED Notes (Signed)
 Pt screaming and cussing at ED staff

## 2024-07-03 ENCOUNTER — Ambulatory Visit: Payer: Self-pay

## 2024-07-06 ENCOUNTER — Ambulatory Visit: Admitting: Student

## 2024-07-06 ENCOUNTER — Other Ambulatory Visit (HOSPITAL_COMMUNITY)
Admission: RE | Admit: 2024-07-06 | Discharge: 2024-07-06 | Disposition: A | Discharge status: Home / Self Care | Source: Ambulatory Visit | Attending: Family Medicine | Admitting: Family Medicine

## 2024-07-06 ENCOUNTER — Encounter: Payer: Self-pay | Admitting: Student

## 2024-07-06 VITALS — BP 108/86 | HR 86 | Ht 68.0 in | Wt 135.0 lb

## 2024-07-06 DIAGNOSIS — N949 Unspecified condition associated with female genital organs and menstrual cycle: Secondary | ICD-10-CM | POA: Insufficient documentation

## 2024-07-06 DIAGNOSIS — R399 Unspecified symptoms and signs involving the genitourinary system: Secondary | ICD-10-CM | POA: Diagnosis not present

## 2024-07-06 DIAGNOSIS — Z111 Encounter for screening for respiratory tuberculosis: Secondary | ICD-10-CM | POA: Diagnosis not present

## 2024-07-06 DIAGNOSIS — N926 Irregular menstruation, unspecified: Secondary | ICD-10-CM

## 2024-07-06 LAB — POCT URINE PREGNANCY: Preg Test, Ur: NEGATIVE

## 2024-07-06 LAB — POCT URINALYSIS DIP (MANUAL ENTRY)
Bilirubin, UA: NEGATIVE
Blood, UA: NEGATIVE
Glucose, UA: NEGATIVE mg/dL
Ketones, POC UA: NEGATIVE mg/dL
Nitrite, UA: NEGATIVE
Protein Ur, POC: NEGATIVE mg/dL
Spec Grav, UA: 1.02
Urobilinogen, UA: 0.2 U/dL
pH, UA: 7

## 2024-07-06 LAB — POCT UA - MICROSCOPIC ONLY
Epithelial cells, urine per micros: NONE SEEN
RBC, Urine, Miroscopic: NONE SEEN

## 2024-07-06 NOTE — Patient Instructions (Signed)
  Estuvo muy bien verte! Gracias por permitirme participar en su cuidado!   Nuestros planes para hoy: - Utilice crema de permetrina al 5%. - Por favor acuda a su cita con dermatologa.   Tenga cuidado y busque atencin inmediata lo antes posible si tiene alguna inquietud. Recuerde presentarse 15 minutos antes de la hora programada para su cita!  Martin Bronson, DO [facility]  

## 2024-07-06 NOTE — Progress Notes (Signed)
" ° ° °  SUBJECTIVE:   CHIEF COMPLAINT / HPI:   Urinary frequency  STI testing  missed menses  need for TB screening -1 week of urinary frequency, no dysuria - Reports unprotected intercourse with boyfriend, boyfriend with vague symptoms and patient with vaginal irritation requesting STI testing - 2 weeks missed menses, reports Nexplanon  is 1 year expired - Requesting TB screening, prefers blood test due to sensitive skin  OBJECTIVE:   Ht 5' 8 (1.727 m)   Wt 135 lb (61.2 kg)   BMI 20.53 kg/m    Cardio: RRR, no murmurs Respiratory: CTAB, normal breathing on room air Skin: Warm and dry GU Exam: Deferred  ASSESSMENT/PLAN:   Assessment & Plan Vaginal discomfort - Aptima GC chlamydia/BV/Candida/trichomonas, self swab per patient preference, will follow-up results - RPR/HIV ordered Screening-pulmonary TB -Requesting TB screening, requesting blood test - QuantiFERON gold ordered UTI symptoms Urinary frequency - UA today Missed menses -2 weeks past regular menses - 1 year past Nexplanon  removal date per patient - UPT today  Of note patient did not want to wait for results.  Requesting we call her with abnormal results in the morning.  Gladis Church, DO [facility]  "

## 2024-07-07 ENCOUNTER — Ambulatory Visit: Payer: Self-pay | Admitting: Student

## 2024-07-07 DIAGNOSIS — A749 Chlamydial infection, unspecified: Secondary | ICD-10-CM

## 2024-07-08 LAB — CERVICOVAGINAL ANCILLARY ONLY
Bacterial Vaginitis (gardnerella): POSITIVE — AB
Candida Glabrata: NEGATIVE
Candida Vaginitis: NEGATIVE
Chlamydia: POSITIVE — AB
Comment: NEGATIVE
Comment: NEGATIVE
Comment: NEGATIVE
Comment: NEGATIVE
Comment: NEGATIVE
Comment: NORMAL
Neisseria Gonorrhea: NEGATIVE
Trichomonas: NEGATIVE

## 2024-07-08 LAB — QUANTIFERON-TB GOLD PLUS
QuantiFERON Mitogen Value: >10 [IU]/mL
QuantiFERON Nil Value: 0.05 [IU]/mL
QuantiFERON TB1 Ag Value: 0.07 [IU]/mL
QuantiFERON TB2 Ag Value: 0.06 [IU]/mL

## 2024-07-08 MED ORDER — DOXYCYCLINE HYCLATE 100 MG PO TABS: 100.0000 mg | ORAL_TABLET | Freq: Two times a day (BID) | ORAL | 0 refills | Status: AC

## 2024-07-09 ENCOUNTER — Telehealth: Payer: Self-pay | Admitting: Student

## 2024-07-09 NOTE — Telephone Encounter (Signed)
 Placed confidential communicable diease report in the fax pile so it can be sent over to the health department. Fax number is [phone]
